# Patient Record
Sex: Female | Born: 1960 | Race: White | Hispanic: No | Marital: Single | Smoking: Former smoker
Health system: Southern US, Community
[De-identification: ages and names within clinical notes are randomized; demographics above are authoritative.]

## PROBLEM LIST (undated history)

## (undated) DIAGNOSIS — R002 Palpitations: Secondary | ICD-10-CM

## (undated) DIAGNOSIS — M5030 Other cervical disc degeneration, unspecified cervical region: Secondary | ICD-10-CM

## (undated) DIAGNOSIS — Z9889 Other specified postprocedural states: Secondary | ICD-10-CM

## (undated) DIAGNOSIS — R112 Nausea with vomiting, unspecified: Secondary | ICD-10-CM

## (undated) DIAGNOSIS — R011 Cardiac murmur, unspecified: Secondary | ICD-10-CM

## (undated) DIAGNOSIS — I10 Essential (primary) hypertension: Secondary | ICD-10-CM

## (undated) DIAGNOSIS — E785 Hyperlipidemia, unspecified: Secondary | ICD-10-CM

## (undated) HISTORY — PX: TONSILLECTOMY: SUR1361

## (undated) HISTORY — DX: Cardiac murmur, unspecified: R01.1

## (undated) HISTORY — DX: Hyperlipidemia, unspecified: E78.5

---

## 1983-04-22 HISTORY — PX: LAPAROSCOPIC SALPINGOOPHERECTOMY: SUR795

## 2002-01-25 ENCOUNTER — Ambulatory Visit (HOSPITAL_COMMUNITY): Admission: RE | Admit: 2002-01-25 | Discharge: 2002-01-25 | Payer: Self-pay | Admitting: Family Medicine

## 2002-01-25 ENCOUNTER — Encounter: Payer: Self-pay | Admitting: Family Medicine

## 2010-06-26 ENCOUNTER — Ambulatory Visit (INDEPENDENT_AMBULATORY_CARE_PROVIDER_SITE_OTHER): Payer: BC Managed Care – PPO | Admitting: Cardiovascular Disease

## 2010-06-26 ENCOUNTER — Encounter: Payer: Self-pay | Admitting: Cardiovascular Disease

## 2010-06-26 DIAGNOSIS — E785 Hyperlipidemia, unspecified: Secondary | ICD-10-CM

## 2010-06-26 DIAGNOSIS — R072 Precordial pain: Secondary | ICD-10-CM

## 2010-06-26 DIAGNOSIS — Z8679 Personal history of other diseases of the circulatory system: Secondary | ICD-10-CM | POA: Insufficient documentation

## 2010-06-26 DIAGNOSIS — R002 Palpitations: Secondary | ICD-10-CM

## 2010-06-26 DIAGNOSIS — R079 Chest pain, unspecified: Secondary | ICD-10-CM | POA: Insufficient documentation

## 2010-06-26 DIAGNOSIS — R011 Cardiac murmur, unspecified: Secondary | ICD-10-CM

## 2010-06-26 DIAGNOSIS — E782 Mixed hyperlipidemia: Secondary | ICD-10-CM | POA: Insufficient documentation

## 2010-06-27 ENCOUNTER — Encounter: Payer: Self-pay | Admitting: Cardiovascular Disease

## 2010-06-28 ENCOUNTER — Ambulatory Visit (HOSPITAL_COMMUNITY)
Admission: RE | Admit: 2010-06-28 | Discharge: 2010-06-28 | Disposition: A | Discharge status: Home / Self Care | Payer: BC Managed Care – PPO | Source: Ambulatory Visit | Attending: Cardiovascular Disease | Admitting: Cardiovascular Disease

## 2010-06-28 ENCOUNTER — Encounter: Payer: Self-pay | Admitting: Cardiovascular Disease

## 2010-06-28 ENCOUNTER — Ambulatory Visit (HOSPITAL_COMMUNITY): Payer: BC Managed Care – PPO

## 2010-06-28 DIAGNOSIS — R011 Cardiac murmur, unspecified: Secondary | ICD-10-CM | POA: Insufficient documentation

## 2010-06-28 DIAGNOSIS — I359 Nonrheumatic aortic valve disorder, unspecified: Secondary | ICD-10-CM

## 2010-07-02 NOTE — Letter (Signed)
Summary: [facility] RECORDS   Imported By: Faythe Ghee 06/27/2010 11:38:48  _____________________________________________________________________  External Attachment:    Type:   Image     Comment:   External Document

## 2010-07-02 NOTE — Letter (Signed)
Summary: Stress Echocardiogram Information Sheet  [facility] at [facility]  [address], [place] [address]   Phone: [phone]  Fax: [phone]      June 26, 2010 MRN: [id number] light prior to the test.   [facility]  Doctor: Appointment Date: Appointment Time: Appointment Location: [facility]  Stress Echocardiogram Information Sheet    Instructions:   1. DO NOT  take your _________ medicine _______ days before the test.  2. Do not eat or drink after midnight the night before test.  3. Dress prepared to exercise.  4. DO NOT use ANY caffine or tobacco products 3 hours before appointment.  5. Report to the Short Stay Center on the1st floor.  6. Please bring all current prescription medications.  7. If you have any questions, please call [phone]

## 2010-07-02 NOTE — Letter (Signed)
Summary: [facility]  [facility]   Imported By: Faythe Ghee 06/27/2010 11:40:01  _____________________________________________________________________  External Attachment:    Type:   Image     Comment:   External Document

## 2010-07-02 NOTE — Assessment & Plan Note (Signed)
Summary: *** PALPITATIONS DR Phillips Odor [id number]/ REC ON FILE 06/11/10/TMJ   Visit Type:  Initial Consult Primary Provider:  Dr.Fusco  CC:  palpatations .  History of Present Illness: Seen at the request of Dr Phillips Odor for palpitations, SSCP, family history of premature CAD and murmur.  She has had palpitations for the lst few weeks.  They preceded a URI that she has been Rx with antibiotics and depomedrol.  She seems overly concerned about her faimily hisotry.  Chest tightness with URI but improved.  Palpitaitons also improved but did have some rapid beats about 5 days ago.  No associated dyspnea, syncope, TIA or diaphoresis.  Murmur diagnosed 8-10 years ago but no recent echo.  CRF's family histroy and elevated lipids.  On statin last 5 years  Current Problems (verified): 1)  Murmur  (ICD-785.2)  Current Medications (verified): 1)  Crestor 10 Mg Tabs (Rosuvastatin Calcium) .... Take 1 Tab Daily  Allergies (verified): No Known Drug Allergies  Comments:  Nurse/Medical Assistant: patient is only on 1 med crestor 10 mg [facility] [place]  Past History:  Past Medical History: Last updated: 06/25/2010 upper respitory infection hyperlipidemia undiagnosed cardiac murmurs  Past Surgical History: Last updated: 06/25/2010 no surgical history noted  Family History: Multiple family members including brother with premature CAD  Social History: Works at [employer] Plays gold and bowls Nonsmoker Nondrinker  Review of Systems       Denies fever, malais, weight loss, blurry vision, decreased visual acuity, cough, sputum, SOB, hemoptysis, pleuritic pain,  heartburn, abdominal pain, melena, lower extremity edema, claudication, or rash.   Vital Signs:  Patient profile:   50 year old female Height:      58 inches Weight:      129 pounds BMI:     27.06 Pulse rate:   68 / minute BP sitting:   134 / 79  (left arm)  Vitals Entered By: Larita Fife Via LPN (June 26, 8655 1:15  PM)  Physical Exam  General:  Affect appropriate Healthy:  appears stated age HEENT: normal Neck supple with no adenopathy JVP normal no bruits no thyromegaly Lungs clear with no wheezing and good diaphragmatic motion Heart:  S1/S2 SEM in aortic area no rub, gallop or click PMI normal Abdomen: benighn, BS positve, no tenderness, no AAA no bruit.  No HSM or HJR Distal pulses intact with no bruits No edema Neuro non-focal Skin warm and dry    Impression & Recommendations:  Problem # 1:  MURMUR (ICD-785.2) R/O bicuspid AV  Echo  Orders: 2-D Echocardiogram (2D Echo) Stress Echo (Stress Echo)  Problem # 2:  CHEST PAIN UNSPECIFIED (ICD-786.50) Associated with congestion and URI Normal ECG  Stress echo  Problem # 3:  PALPITATIONS, HX OF (ICD-V12.50) Benign.  Improving No need for event monitor or Rx if echo and stress echo ok  Problem # 4:  MIXED HYPERLIPIDEMIA (ICD-272.2) Continue statin  Labs with Dr Phillips Odor Her updated medication list for this problem includes:    Crestor 10 Mg Tabs (Rosuvastatin calcium) .Marland Kitchen... Take 1 tab daily  Patient Instructions: 1)  Your physician recommends that you schedule a follow-up appointment in: as needed  2)  Your physician recommends that you continue on your current medications as directed. Please refer to the Current Medication list given to you today. 3)  Your physician has requested that you have a stress echocardiogram. For further information please visit [email or web address]  Please follow instruction sheet as given. 4)  Your physician has requested  that you have an echocardiogram.  Echocardiography is a painless test that uses sound waves to create images of your heart. It provides your doctor with information about the size and shape of your heart and how well your heart's chambers and valves are working.  This procedure takes approximately one hour. There are no restrictions for this procedure.   EKG Report  Procedure date:   06/26/2010  Findings:      NSR 63 Normal ECG

## 2010-11-04 ENCOUNTER — Encounter: Payer: Self-pay | Admitting: *Deleted

## 2010-11-04 ENCOUNTER — Telehealth: Payer: Self-pay | Admitting: Cardiology

## 2010-11-04 NOTE — Telephone Encounter (Signed)
Copy of report and result letter mailed to pt, also called report to pt-Ths

## 2010-11-04 NOTE — Telephone Encounter (Signed)
Pt stopped into office to have there results of stress test and to be called on home phone.Pt would like a copy of report to be mailed to home as well to sending a copy to Dr. Lenore Manner.

## 2010-11-07 NOTE — Telephone Encounter (Signed)
Not certain why this was forwarded to me. Patient was seen by Dr. Eden Emms recently with subsequent testing arranged.

## 2011-04-16 ENCOUNTER — Encounter: Payer: Self-pay | Admitting: Cardiology

## 2011-05-20 ENCOUNTER — Other Ambulatory Visit (HOSPITAL_COMMUNITY): Payer: Self-pay | Admitting: Physical Medicine and Rehabilitation

## 2011-05-20 DIAGNOSIS — M5412 Radiculopathy, cervical region: Secondary | ICD-10-CM

## 2011-05-26 ENCOUNTER — Ambulatory Visit (HOSPITAL_COMMUNITY)
Admission: RE | Admit: 2011-05-26 | Discharge: 2011-05-26 | Disposition: A | Discharge status: Home / Self Care | Payer: BC Managed Care – PPO | Source: Ambulatory Visit | Attending: Physical Medicine and Rehabilitation | Admitting: Physical Medicine and Rehabilitation

## 2011-05-26 DIAGNOSIS — M5412 Radiculopathy, cervical region: Secondary | ICD-10-CM

## 2011-05-26 DIAGNOSIS — M25519 Pain in unspecified shoulder: Secondary | ICD-10-CM | POA: Insufficient documentation

## 2011-05-26 DIAGNOSIS — M5030 Other cervical disc degeneration, unspecified cervical region: Secondary | ICD-10-CM | POA: Insufficient documentation

## 2011-05-26 DIAGNOSIS — M542 Cervicalgia: Secondary | ICD-10-CM | POA: Insufficient documentation

## 2011-05-26 DIAGNOSIS — J328 Other chronic sinusitis: Secondary | ICD-10-CM | POA: Insufficient documentation

## 2013-11-18 NOTE — H&P (Signed)
  NTS SOAP Note  Vital Signs:  Vitals as of: 7/58/8325: Systolic 498: Diastolic 79: Heart Rate 68: Temp 4F: Height 67ft 10in: Weight 128Lbs 0 Ounces: BMI 26.75  BMI : 26.75 kg/m2  Subjective: This 53 Years 54 Months old Female presents for of a wound in the perianal region.  Has been present for a year, keeps draining intermittently.  Antibiotics have been used.  Review of Symptoms:  Constitutional:unremarkable   Head:unremarkable    Eyes:unremarkable   Nose/Mouth/Throat:unremarkable Cardiovascular:  unremarkable   Respiratory:unremarkable   Gastrointestinal:  unremarkable   Genitourinary:unremarkable     Musculoskeletal:unremarkable   Hematolgic/Lymphatic:unremarkable     Allergic/Immunologic:unremarkable     Past Medical History:    Reviewed  Past Medical History  Surgical History: none Medical Problems: HTN, high cholesterol Allergies: pcn Medications: gabapentin, neicar, crestor   Social History:Reviewed  Social History  Preferred Language: English Race:  White Ethnicity: Not Hispanic / Latino Age: 53 Years 54 Months Marital Status:  S Alcohol: socially   Smoking Status: Never smoker reviewed on 11/17/2013 Functional Status reviewed on 11/17/2013 ------------------------------------------------ Bathing: Normal Cooking: Normal Dressing: Normal Driving: Normal Eating: Normal Managing Meds: Normal Oral Care: Normal Shopping: Normal Toileting: Normal Transferring: Normal Walking: Normal Cognitive Status reviewed on 11/17/2013 ------------------------------------------------ Attention: Normal Decision Making: Normal Language: Normal Memory: Normal Motor: Normal Perception: Normal Problem Solving: Normal Visual and Spatial: Normal   Family History:  Reviewed  Family Health History Mother, Deceased; Heart disease;  Father, Living; Heart disease;     Objective Information: General:  Well appearing,  well nourished in no distress. Heart:  RRR, no murmur or gallop.  Normal S1, S2.  No S3, S4.  Lungs:    CTA bilaterally, no wheezes, rhonchi, rales.  Breathing unlabored.     Small draining sinus with mild induration, left perianal region  Assessment:Residual perianal abscess  Diagnoses: 566 Perianal abscess (Anal abscess)  Procedures: 26415 - OFFICE OUTPATIENT NEW 30 MINUTES    Plan:  Scheduled for excision and drainage of perianal abscess on 12/12/13.   Patient Education:Alternative treatments to surgery were discussed with patient (and family).  Risks and benefits  of procedure were fully explained to the patient (and family) who gave informed consent. Patient/family questions were addressed.  Follow-up:Pending Surgery

## 2013-11-28 ENCOUNTER — Encounter (HOSPITAL_COMMUNITY): Payer: Self-pay | Admitting: Pharmacy Technician

## 2013-12-06 NOTE — Patient Instructions (Signed)
Amy Clayton  12/06/2013   Your procedure is scheduled on:  12/12/2013  Report to [facility] at  900  AM.  Call this number if you have problems the morning of surgery: [phone]   Remember:   Do not eat food or drink liquids after midnight.   Take these medicines the morning of surgery with A SIP OF WATER: neurontin, benicar   Do not wear jewelry, make-up or nail polish.  Do not wear lotions, powders, or perfumes.   Do not shave 48 hours prior to surgery. Men may shave face and neck.  Do not bring valuables to the hospital.  [facility] is not responsible for any belongings or valuables.               Contacts, dentures or bridgework may not be worn into surgery.  Leave suitcase in the car. After surgery it may be brought to your room.  For patients admitted to the hospital, discharge time is determined by your treatment team.               Patients discharged the day of surgery will not be allowed to drive home.  Name and phone number of your driver: family  Special Instructions: Shower using CHG 2 nights before surgery and the night before surgery.  If you shower the day of surgery use CHG.  Use special wash - you have one bottle of CHG for all showers.  You should use approximately 1/3 of the bottle for each shower.   Please read over the following fact sheets that you were given: Pain Booklet, Coughing and Deep Breathing, Surgical Site Infection Prevention, Anesthesia Post-op Instructions and Care and Recovery After Surgery Perianal Abscess An abscess is an infected area that contains a collection of pus and debris. A perianal abscess is one that occurs in the perineal area, which is the area between the anus and the scrotum in males and between the anus and the vagina in females. Perianal abscesses can vary in size. Without treatment, a perianal abscess can become larger and cause other problems. CAUSES  Glands in the perineal area can become plugged up with debris.  When this happens, an abscess may form.  SIGNS AND SYMPTOMS  The most common symptoms of a perianal abscess are:  Swelling and redness in the area of the abscess. The redness may go beyond the abscess and appear as a red streak on the skin.  Pain in the area of the abscess. Other possible symptoms include:   A visible lump or a lump that can be felt when touching the area and is usually painful.  Bleeding or pus-like discharge from the area.  Fever.  General weakness. DIAGNOSIS  Your health care provider will take a medical history and examine the area. This may involve examining the rectal area with a gloved hand (digital rectal exam). For women, it may require a careful vaginal exam. Sometimes, the health care provider needs to look into the rectum using a probe or scope. TREATMENT  Treatment often requires making a cut (incision) in the abscess to drain the pus. This can sometimes be done in your health care provider's office or an emergency department after giving you medicine to numb the area (local anesthetic). For larger or deeper abscesses, surgery may be required to drain the abscess. Antibiotic medicines are sometimes given if there is infection of the surrounding tissue (cellulitis). In some cases, gauze is packed into the abscess  to continue draining the area. Frequent sitz baths may be recommended to help the wound heal and to reduce the chance of the abscess coming back. HOME CARE INSTRUCTIONS   Only take over-the-counter or prescription medicines for pain, fever, or discomfort as directed by your health care provider.  Take antibiotic medicine as directed. Make sure you finish it even if you start to feel better.  If gauze is used in the abscess, follow your health care provider's instructions for removing or changing the gauze. It can usually be removed in 2-3 days.  If one or more drains have been placed in the abscess cavity, be careful not to pull at them. Your health  care provider will tell you how long they need to remain in place.  Take warm sitz baths 3-4 times a day and after bowel movements. This will help reduce pain and swelling.  Keep the skin around the abscess clean and dry. Avoid cleaning the area too much.  Avoid scratching the abscess area.  Avoid using colored or perfumed toilet papers. SEEK MEDICAL CARE IF:   You have trouble having a bowel movement or passing urine.  Your pain or swelling in the affected area does not seem to be improving.  The gauze packing or the drains come out before the planned time. SEEK IMMEDIATE MEDICAL CARE IF:   You have problems moving or using your legs.  You have severe or increasing pain.  Your swelling in the affected area suddenly gets worse.  You have a large increase in bleeding or passing of pus.  You have chills or a fever. MAKE SURE YOU:   Understand these instructions.  Will watch your condition.  Will get help right away if you are not doing well or get worse. Document Released: 05/14/2006 Document Revised: 01/26/2013 Document Reviewed: 11/17/2012 [facility] Patient Information 2015 [place], [place]. This information is not intended to replace advice given to you by your health care provider. Make sure you discuss any questions you have with your health care provider. PATIENT INSTRUCTIONS POST-ANESTHESIA  IMMEDIATELY FOLLOWING SURGERY:  Do not drive or operate machinery for the first twenty four hours after surgery.  Do not make any important decisions for twenty four hours after surgery or while taking narcotic pain medications or sedatives.  If you develop intractable nausea and vomiting or a severe headache please notify your doctor immediately.  FOLLOW-UP:  Please make an appointment with your surgeon as instructed. You do not need to follow up with anesthesia unless specifically instructed to do so.  WOUND CARE INSTRUCTIONS (if applicable):  Keep a dry clean dressing on the  anesthesia/puncture wound site if there is drainage.  Once the wound has quit draining you may leave it open to air.  Generally you should leave the bandage intact for twenty four hours unless there is drainage.  If the epidural site drains for more than 36-48 hours please call the anesthesia department.  QUESTIONS?:  Please feel free to call your physician or the hospital operator if you have any questions, and they will be happy to assist you.

## 2013-12-07 ENCOUNTER — Encounter (HOSPITAL_COMMUNITY)
Admission: RE | Admit: 2013-12-07 | Discharge: 2013-12-07 | Disposition: A | Discharge status: Home / Self Care | Payer: BC Managed Care – PPO | Source: Ambulatory Visit | Attending: General Surgery | Admitting: General Surgery

## 2013-12-07 ENCOUNTER — Encounter (HOSPITAL_COMMUNITY): Payer: Self-pay

## 2013-12-07 DIAGNOSIS — K612 Anorectal abscess: Secondary | ICD-10-CM | POA: Diagnosis not present

## 2013-12-07 DIAGNOSIS — Z01812 Encounter for preprocedural laboratory examination: Secondary | ICD-10-CM | POA: Insufficient documentation

## 2013-12-07 DIAGNOSIS — Z01810 Encounter for preprocedural cardiovascular examination: Secondary | ICD-10-CM | POA: Insufficient documentation

## 2013-12-07 DIAGNOSIS — Z01818 Encounter for other preprocedural examination: Secondary | ICD-10-CM | POA: Diagnosis not present

## 2013-12-07 HISTORY — DX: Essential (primary) hypertension: I10

## 2013-12-07 LAB — CBC WITH DIFFERENTIAL/PLATELET
Basophils Absolute: 0 10*3/uL (ref 0.0–0.1)
Basophils Relative: 1 % (ref 0–1)
Eosinophils Absolute: 0.2 10*3/uL (ref 0.0–0.7)
Eosinophils Relative: 3 % (ref 0–5)
HEMATOCRIT: 41.8 % (ref 36.0–46.0)
HEMOGLOBIN: 14.4 g/dL (ref 12.0–15.0)
Lymphocytes Relative: 37 % (ref 12–46)
Lymphs Abs: 2.7 10*3/uL (ref 0.7–4.0)
MCH: 30.2 pg (ref 26.0–34.0)
MCHC: 34.4 g/dL (ref 30.0–36.0)
MCV: 87.6 fL (ref 78.0–100.0)
MONOS PCT: 7 % (ref 3–12)
Monocytes Absolute: 0.5 10*3/uL (ref 0.1–1.0)
NEUTROS ABS: 3.9 10*3/uL (ref 1.7–7.7)
Neutrophils Relative %: 52 % (ref 43–77)
Platelets: 306 10*3/uL (ref 150–400)
RBC: 4.77 MIL/uL (ref 3.87–5.11)
RDW: 13.2 % (ref 11.5–15.5)
WBC: 7.4 10*3/uL (ref 4.0–10.5)

## 2013-12-07 LAB — BASIC METABOLIC PANEL
ANION GAP: 13 (ref 5–15)
BUN: 27 mg/dL — ABNORMAL HIGH (ref 6–23)
CHLORIDE: 99 meq/L (ref 96–112)
CO2: 28 meq/L (ref 19–32)
CREATININE: 0.96 mg/dL (ref 0.50–1.10)
Calcium: 10 mg/dL (ref 8.4–10.5)
GFR calc Af Amer: 76 mL/min — ABNORMAL LOW (ref >90)
GFR calc non Af Amer: 66 mL/min — ABNORMAL LOW (ref >90)
GLUCOSE: 102 mg/dL — AB (ref 70–99)
Potassium: 4.8 mEq/L (ref 3.7–5.3)
Sodium: 140 mEq/L (ref 137–147)

## 2013-12-12 ENCOUNTER — Ambulatory Visit (HOSPITAL_COMMUNITY)
Admission: RE | Admit: 2013-12-12 | Discharge: 2013-12-12 | Disposition: A | Discharge status: Home / Self Care | Payer: BC Managed Care – PPO | Source: Ambulatory Visit | Attending: General Surgery | Admitting: General Surgery

## 2013-12-12 ENCOUNTER — Encounter (HOSPITAL_COMMUNITY): Payer: Self-pay | Admitting: *Deleted

## 2013-12-12 ENCOUNTER — Encounter (HOSPITAL_COMMUNITY): Payer: BC Managed Care – PPO | Admitting: Anesthesiology

## 2013-12-12 ENCOUNTER — Encounter (HOSPITAL_COMMUNITY)
Admission: RE | Disposition: A | Discharge status: Home / Self Care | Payer: Self-pay | Source: Ambulatory Visit | Attending: General Surgery

## 2013-12-12 ENCOUNTER — Ambulatory Visit (HOSPITAL_COMMUNITY): Payer: BC Managed Care – PPO | Admitting: Anesthesiology

## 2013-12-12 DIAGNOSIS — Z7982 Long term (current) use of aspirin: Secondary | ICD-10-CM | POA: Insufficient documentation

## 2013-12-12 DIAGNOSIS — Z87891 Personal history of nicotine dependence: Secondary | ICD-10-CM | POA: Insufficient documentation

## 2013-12-12 DIAGNOSIS — E7800 Pure hypercholesterolemia, unspecified: Secondary | ICD-10-CM | POA: Insufficient documentation

## 2013-12-12 DIAGNOSIS — I10 Essential (primary) hypertension: Secondary | ICD-10-CM | POA: Diagnosis not present

## 2013-12-12 DIAGNOSIS — Z79899 Other long term (current) drug therapy: Secondary | ICD-10-CM | POA: Insufficient documentation

## 2013-12-12 DIAGNOSIS — K612 Anorectal abscess: Secondary | ICD-10-CM | POA: Diagnosis present

## 2013-12-12 HISTORY — PX: INCISION AND DRAINAGE ABSCESS: SHX5864

## 2013-12-12 SURGERY — INCISION AND DRAINAGE, ABSCESS
Anesthesia: General | Site: Buttocks

## 2013-12-12 MED ORDER — ONDANSETRON HCL 4 MG/2ML IJ SOLN: 4.0000 mg | Freq: Once | INTRAMUSCULAR | Status: DC | PRN

## 2013-12-12 MED ORDER — EPHEDRINE SULFATE 50 MG/ML IJ SOLN
INTRAMUSCULAR | Status: AC
  Filled 2013-12-12: qty 1

## 2013-12-12 MED ORDER — SUCCINYLCHOLINE CHLORIDE 20 MG/ML IJ SOLN
INTRAMUSCULAR | Status: AC
  Filled 2013-12-12: qty 1

## 2013-12-12 MED ORDER — MIDAZOLAM HCL 2 MG/2ML IJ SOLN
1.0000 mg | INTRAMUSCULAR | Status: DC | PRN
  Administered 2013-12-12: 2 mg via INTRAVENOUS

## 2013-12-12 MED ORDER — METRONIDAZOLE IN NACL 5-0.79 MG/ML-% IV SOLN
500.0000 mg | INTRAVENOUS | Status: AC
  Administered 2013-12-12: 1 g via INTRAVENOUS

## 2013-12-12 MED ORDER — MIDAZOLAM HCL 2 MG/2ML IJ SOLN
INTRAMUSCULAR | Status: AC
  Filled 2013-12-12: qty 2

## 2013-12-12 MED ORDER — METRONIDAZOLE IN NACL 5-0.79 MG/ML-% IV SOLN
INTRAVENOUS | Status: AC
  Filled 2013-12-12: qty 100

## 2013-12-12 MED ORDER — HYDROCODONE-ACETAMINOPHEN 5-325 MG PO TABS: 1.0000 | ORAL_TABLET | Freq: Four times a day (QID) | ORAL | Status: DC | PRN

## 2013-12-12 MED ORDER — FENTANYL CITRATE 0.05 MG/ML IJ SOLN
INTRAMUSCULAR | Status: DC | PRN
  Administered 2013-12-12: 25 ug via INTRAVENOUS
  Administered 2013-12-12: 50 ug via INTRAVENOUS
  Administered 2013-12-12: 25 ug via INTRAVENOUS

## 2013-12-12 MED ORDER — FENTANYL CITRATE 0.05 MG/ML IJ SOLN
INTRAMUSCULAR | Status: AC
  Filled 2013-12-12: qty 2

## 2013-12-12 MED ORDER — KETOROLAC TROMETHAMINE 30 MG/ML IJ SOLN
30.0000 mg | Freq: Once | INTRAMUSCULAR | Status: AC
  Administered 2013-12-12: 30 mg via INTRAVENOUS
  Filled 2013-12-12: qty 1

## 2013-12-12 MED ORDER — CHLORHEXIDINE GLUCONATE 4 % EX LIQD: 1.0000 "application " | Freq: Once | CUTANEOUS | Status: DC

## 2013-12-12 MED ORDER — SODIUM CHLORIDE 0.9 % IJ SOLN
INTRAMUSCULAR | Status: AC
  Filled 2013-12-12: qty 10

## 2013-12-12 MED ORDER — FENTANYL CITRATE 0.05 MG/ML IJ SOLN: 25.0000 ug | INTRAMUSCULAR | Status: DC | PRN

## 2013-12-12 MED ORDER — LACTATED RINGERS IV SOLN
INTRAVENOUS | Status: DC
  Administered 2013-12-12: 10:00:00 via INTRAVENOUS

## 2013-12-12 MED ORDER — BUPIVACAINE HCL (PF) 0.5 % IJ SOLN
INTRAMUSCULAR | Status: DC | PRN
  Administered 2013-12-12: 5 mL

## 2013-12-12 MED ORDER — ONDANSETRON HCL 4 MG/2ML IJ SOLN
4.0000 mg | Freq: Once | INTRAMUSCULAR | Status: AC
  Administered 2013-12-12: 4 mg via INTRAVENOUS

## 2013-12-12 MED ORDER — ARTIFICIAL TEARS OP OINT
TOPICAL_OINTMENT | OPHTHALMIC | Status: AC
  Filled 2013-12-12: qty 3.5

## 2013-12-12 MED ORDER — CIPROFLOXACIN IN D5W 400 MG/200ML IV SOLN
INTRAVENOUS | Status: AC
  Filled 2013-12-12: qty 200

## 2013-12-12 MED ORDER — PROPOFOL 10 MG/ML IV BOLUS
INTRAVENOUS | Status: DC | PRN
  Administered 2013-12-12: 150 mg via INTRAVENOUS

## 2013-12-12 MED ORDER — SODIUM CHLORIDE 0.9 % IR SOLN
Status: DC | PRN
  Administered 2013-12-12: 1000 mL

## 2013-12-12 MED ORDER — ONDANSETRON HCL 4 MG/2ML IJ SOLN
INTRAMUSCULAR | Status: AC
  Filled 2013-12-12: qty 2

## 2013-12-12 MED ORDER — LIDOCAINE HCL (PF) 1 % IJ SOLN
INTRAMUSCULAR | Status: AC
  Filled 2013-12-12: qty 5

## 2013-12-12 MED ORDER — FENTANYL CITRATE 0.05 MG/ML IJ SOLN
25.0000 ug | INTRAMUSCULAR | Status: AC
  Administered 2013-12-12 (×2): 25 ug via INTRAVENOUS

## 2013-12-12 MED ORDER — FENTANYL CITRATE 0.05 MG/ML IJ SOLN
INTRAMUSCULAR | Status: AC
  Filled 2013-12-12: qty 5

## 2013-12-12 MED ORDER — BUPIVACAINE HCL (PF) 0.5 % IJ SOLN
INTRAMUSCULAR | Status: AC
  Filled 2013-12-12: qty 30

## 2013-12-12 MED ORDER — LACTATED RINGERS IV SOLN
INTRAVENOUS | Status: DC | PRN
Start: 2013-12-12 — End: 2013-12-12
  Administered 2013-12-12: 10:00:00 via INTRAVENOUS

## 2013-12-12 MED ORDER — CIPROFLOXACIN IN D5W 400 MG/200ML IV SOLN
400.0000 mg | INTRAVENOUS | Status: AC
  Administered 2013-12-12: 400 mg via INTRAVENOUS
  Filled 2013-12-12: qty 200

## 2013-12-12 MED ORDER — LIDOCAINE HCL (CARDIAC) 20 MG/ML IV SOLN
INTRAVENOUS | Status: DC | PRN
  Administered 2013-12-12: 50 mg via INTRAVENOUS

## 2013-12-12 MED ORDER — BACITRACIN-NEOMYCIN-POLYMYXIN 400-5-5000 EX OINT
TOPICAL_OINTMENT | CUTANEOUS | Status: AC
  Filled 2013-12-12: qty 1

## 2013-12-12 SURGICAL SUPPLY — 30 items
BAG HAMPER (MISCELLANEOUS) ×2 IMPLANT
BNDG CONFORM 2 STRL LF (GAUZE/BANDAGES/DRESSINGS) ×1 IMPLANT
CLOTH BEACON ORANGE TIMEOUT ST (SAFETY) ×2 IMPLANT
COVER LIGHT HANDLE STERIS (MISCELLANEOUS) ×4 IMPLANT
DRESSING COVERLET 3X1 FLEXIBLE (GAUZE/BANDAGES/DRESSINGS) ×1 IMPLANT
ELECT REM PT RETURN 9FT ADLT (ELECTROSURGICAL) ×2
ELECTRODE REM PT RTRN 9FT ADLT (ELECTROSURGICAL) ×1 IMPLANT
GAUZE SPONGE 4X4 12PLY STRL (GAUZE/BANDAGES/DRESSINGS) IMPLANT
GLOVE BIOGEL M STRL SZ7.5 (GLOVE) ×2 IMPLANT
GLOVE INDICATOR 7.0 STRL GRN (GLOVE) ×1 IMPLANT
GLOVE SS BIOGEL STRL SZ 6.5 (GLOVE) IMPLANT
GLOVE SUPERSENSE BIOGEL SZ 6.5 (GLOVE) ×1
GLOVE SURG SS PI 7.5 STRL IVOR (GLOVE) ×2 IMPLANT
GOWN STRL REUS W/TWL LRG LVL3 (GOWN DISPOSABLE) ×3 IMPLANT
KIT ROOM TURNOVER APOR (KITS) ×2 IMPLANT
MANIFOLD NEPTUNE II (INSTRUMENTS) ×2 IMPLANT
MARKER SKIN DUAL TIP RULER LAB (MISCELLANEOUS) ×2 IMPLANT
NDL HYPO 25X1 1.5 SAFETY (NEEDLE) IMPLANT
NEEDLE HYPO 25X1 1.5 SAFETY (NEEDLE) ×2 IMPLANT
NS IRRIG 1000ML POUR BTL (IV SOLUTION) ×2 IMPLANT
PACK BASIC LIMB (CUSTOM PROCEDURE TRAY) IMPLANT
PACK MINOR (CUSTOM PROCEDURE TRAY) ×1 IMPLANT
PACK PERI GYN (CUSTOM PROCEDURE TRAY) ×1 IMPLANT
PAD ABD 5X9 TENDERSORB (GAUZE/BANDAGES/DRESSINGS) IMPLANT
PAD ARMBOARD 7.5X6 YLW CONV (MISCELLANEOUS) ×2 IMPLANT
SET BASIN LINEN APH (SET/KITS/TRAYS/PACK) ×2 IMPLANT
SUT CHROMIC 2 0 SH (SUTURE) IMPLANT
SUT CHROMIC 3 0 SH 27 (SUTURE) ×1 IMPLANT
SYR BULB IRRIGATION 50ML (SYRINGE) ×2 IMPLANT
SYRINGE 12CC LL (MISCELLANEOUS) ×1 IMPLANT

## 2013-12-12 NOTE — Interval H&P Note (Signed)
History and Physical Interval Note:  12/12/2013 10:06 AM  Amy Clayton  has presented today for surgery, with the diagnosis of peri-rectal abscess  The various methods of treatment have been discussed with the patient and family. After consideration of risks, benefits and other options for treatment, the patient has consented to  Procedure(s): INCISION AND DRAINAGE PERIRECTAL  ABSCESS (N/A) as a surgical intervention .  The patient's history has been reviewed, patient examined, no change in status, stable for surgery.  I have reviewed the patient's chart and labs.  Questions were answered to the patient's satisfaction.     Aviva Signs A

## 2013-12-12 NOTE — Anesthesia Postprocedure Evaluation (Signed)
  Anesthesia Post-op Note  Patient: Amy Clayton  Procedure(s) Performed: Procedure(s): INCISION AND DRAINAGE PERIRECTAL  ABSCESS (N/A)  Patient Location: PACU  Anesthesia Type:General  Level of Consciousness: awake, alert , oriented and patient cooperative  Airway and Oxygen Therapy: Patient Spontanous Breathing and Patient connected to face mask oxygen  Post-op Pain: mild  Post-op Assessment: Post-op Vital signs reviewed, Patient's Cardiovascular Status Stable, Respiratory Function Stable, Patent Airway, No signs of Nausea or vomiting and Pain level controlled  Post-op Vital Signs: Reviewed and stable  Last Vitals:  Filed Vitals:   12/12/13 1015  BP: 124/62  Pulse:   Temp:   Resp: 23    Complications: No apparent anesthesia complications

## 2013-12-12 NOTE — Anesthesia Preprocedure Evaluation (Signed)
Anesthesia Evaluation  Patient identified by MRN, date of birth, ID band Patient awake    Reviewed: Allergy & Precautions, H&P , NPO status , Patient's Chart, lab work & pertinent test results  Airway Mallampati: II TM Distance: >3 FB     Dental  (+) Teeth Intact   Pulmonary former smoker,  breath sounds clear to auscultation        Cardiovascular hypertension, Pt. on medications negative cardio ROS  Rhythm:Regular Rate:Normal     Neuro/Psych    GI/Hepatic negative GI ROS,   Endo/Other    Renal/GU      Musculoskeletal   Abdominal   Peds  Hematology   Anesthesia Other Findings   Reproductive/Obstetrics                           Anesthesia Physical Anesthesia Plan  ASA: II  Anesthesia Plan: General   Post-op Pain Management:    Induction: Intravenous  Airway Management Planned: Oral ETT  Additional Equipment:   Intra-op Plan:   Post-operative Plan: Extubation in OR  Informed Consent: I have reviewed the patients History and Physical, chart, labs and discussed the procedure including the risks, benefits and alternatives for the proposed anesthesia with the patient or authorized representative who has indicated his/her understanding and acceptance.     Plan Discussed with:   Anesthesia Plan Comments:         Anesthesia Quick Evaluation

## 2013-12-12 NOTE — Anesthesia Procedure Notes (Signed)
Procedure Name: LMA Insertion Date/Time: 12/12/2013 10:27 AM Performed by: Andree Elk, AMY A Pre-anesthesia Checklist: Patient identified, Timeout performed, Emergency Drugs available, Suction available and Patient being monitored Patient Re-evaluated:Patient Re-evaluated prior to inductionOxygen Delivery Method: Circle system utilized Preoxygenation: Pre-oxygenation with 100% oxygen Intubation Type: IV induction Ventilation: Mask ventilation without difficulty LMA: LMA inserted LMA Size: 4.0 Number of attempts: 1 Placement Confirmation: positive ETCO2 and breath sounds checked- equal and bilateral Tube secured with: Tape

## 2013-12-12 NOTE — Transfer of Care (Signed)
Immediate Anesthesia Transfer of Care Note  Patient: Amy Clayton  Procedure(s) Performed: Procedure(s): INCISION AND DRAINAGE PERIRECTAL  ABSCESS (N/A)  Patient Location: PACU  Anesthesia Type:General  Level of Consciousness: awake, alert , oriented and patient cooperative  Airway & Oxygen Therapy: Patient Spontanous Breathing and Patient connected to face mask oxygen  Post-op Assessment: Report given to PACU RN and Post -op Vital signs reviewed and stable  Post vital signs: Reviewed and stable  Complications: No apparent anesthesia complications

## 2013-12-12 NOTE — Op Note (Signed)
Patient:  Amy Clayton  DOB:  26-Sep-1960  MRN:  [id number]   Preop Diagnosis:  Perirectal abscess  Postop Diagnosis:  Same  Procedure:  Incision and debridement, perirectal abscess  Surgeon:  Aviva Signs, M.D.  Anes:  General  Indications:  Patient is a 53 year old white female who presents with a recurrence perirectal abscess. No stool or air has been noted in the drainage. Due to the recurrent nature, she now presents for incision and debridement of the perirectal abscess. The risks and benefits of the procedure were fully explained to the patient, who gave informed consent.  Procedure note:  The patient was placed in the lithotomy position after general anesthesia was administered. The perineum was prepped and draped using usual sterile technique with Betadine. Surgical site confirmation was performed.  A residual perianal granuloma was noted at the 2:00 position on examination. Elliptical incision was made in this region in order to excise the skin opening. The dissection was taken down to the soft tissue. It was probed but no tissue was tract to the rectum was noted. Granulomatous tissue was then debrided using a curette. The wound is irrigated normal saline. Sensorcaine was instilled the surrounding wound. The incision was closed using 3-0 chromic gut interrupted sutures. Bacitracin ointment and a dry sterile dressing were applied.  All tape and needle counts were correct at the end of the procedure. Patient was awakened and transferred to PACU in stable condition.  Complications:  None  EBL:  Minimal  Specimen:  None

## 2013-12-13 ENCOUNTER — Encounter (HOSPITAL_COMMUNITY): Payer: Self-pay | Admitting: General Surgery

## 2014-02-10 ENCOUNTER — Encounter: Payer: Self-pay | Admitting: *Deleted

## 2014-02-13 ENCOUNTER — Encounter: Payer: Self-pay | Admitting: Obstetrics and Gynecology

## 2014-02-13 ENCOUNTER — Ambulatory Visit (INDEPENDENT_AMBULATORY_CARE_PROVIDER_SITE_OTHER): Payer: BC Managed Care – PPO | Admitting: Obstetrics and Gynecology

## 2014-02-13 VITALS — BP 122/70 | Ht <= 58 in | Wt 129.0 lb

## 2014-02-13 DIAGNOSIS — N9089 Other specified noninflammatory disorders of vulva and perineum: Secondary | ICD-10-CM

## 2014-02-13 MED ORDER — DOXYCYCLINE HYCLATE 100 MG PO CAPS: 100.0000 mg | ORAL_CAPSULE | Freq: Two times a day (BID) | ORAL | Status: DC

## 2014-02-13 NOTE — Progress Notes (Addendum)
Patient ID: DNIYAH GRANT, female   DOB: Sep 19, 1960, 53 y.o.   MRN: [id number]   [facility] Visit  Patient name: Amy Clayton MRN [id number]  Date of birth: Oct 16, 1960  CC & HPI:  Amy Clayton is a 53 y.o. female presenting today complaining of a hard, marble-sized nodule on the entrance of her vagina that has been draining for the past week.  She is no longer taking antibiotics.  She saw Dr. Arnoldo Morale on 8/24 for incision and drainage of a perianal abscess.  Her last GYN exam and PAP was last year.  Her PAP was normal at that time.  She does not have a history of vaginal deliveries.    ROS:  All systems have been reviewed and are negative unless otherwise indicated in the HPI.  Pertinent History Reviewed:   Reviewed: Significant for  Medical         Past Medical History  Diagnosis Date  . Hyperlipidemia   . Cardiac murmur   . Upper respiratory infection   . Hypertension                               Surgical Hx:    Past Surgical History  Procedure Laterality Date  . Laparoscopic salpingoopherectomy Right 1985  . Tonsillectomy    . Incision and drainage abscess N/A 12/12/2013    Procedure: INCISION AND DRAINAGE PERIRECTAL  ABSCESS;  Surgeon: Jamesetta So, MD;  Location: [facility];  Service: General;  Laterality: N/A;   Medications: Reviewed & Updated - see associated section                      Current outpatient prescriptions:aspirin 325 MG tablet, Take 325 mg by mouth daily as needed., Disp: , Rfl: ;  gabapentin (NEURONTIN) 250 MG/5ML solution, Take 250 mg by mouth 3 (three) times daily., Disp: , Rfl: ;  ibuprofen (ADVIL,MOTRIN) 200 MG tablet, Take 200 mg by mouth daily as needed (swelling)., Disp: , Rfl: ;  olmesartan-hydrochlorothiazide (BENICAR HCT) 20-12.5 MG per tablet, Take 1 tablet by mouth daily., Disp: , Rfl:  rosuvastatin (CRESTOR) 10 MG tablet, Take 10 mg by mouth daily.  , Disp: , Rfl:    Social History: Reviewed -  reports that she quit smoking  about 5 years ago. Her smoking use included Cigarettes. She has a 25 pack-year smoking history. She has never used smokeless tobacco.  Objective Findings:  Vitals: Blood pressure 122/70, height 4\' 10"  (1.473 m), weight 129 lb (58.514 kg).  Physical Examination: General appearance - alert, well appearing, and in no distress, oriented to person, place, and time and normal appearing weight Pelvic - normal external genitalia, vulva, vagina, and adnexa,  VULVA: normal appearing vulva with no masses, tenderness or lesions, 1cm firm, hard, non-tender mass in the posterior fourchette VAGINA: normal appearing vagina with normal color and discharge, no lesions,  CERVIX: surgically absent UTERUS: surgically absent  Assessment & Plan:   A:  1. Vulvar inclusion cyst  P:  1. 14 days worth of Doxycycline 100 bid 2. Return in 2 wks for recheck  And consideration of aspiration. 3 If it recurs after aspiration, consider excision? 4.     This chart was scribed for Jonnie Kind, MD by Donato Schultz, ED Scribe. This patient was seen in Room 2 and the patient's care was started at 2:31 PM.

## 2014-02-13 NOTE — Progress Notes (Signed)
Patient ID: Amy Clayton, female   DOB: 02-Aug-1960, 53 y.o.   MRN: [id number] Pt referred for a knot in the entrance of her vagina. Pt states that it is the size of a marble and it maybe have been draining for the past week.

## 2014-02-28 ENCOUNTER — Encounter: Payer: Self-pay | Admitting: Obstetrics and Gynecology

## 2014-02-28 ENCOUNTER — Ambulatory Visit (INDEPENDENT_AMBULATORY_CARE_PROVIDER_SITE_OTHER): Payer: BC Managed Care – PPO | Admitting: Obstetrics and Gynecology

## 2014-02-28 VITALS — BP 120/70 | Ht <= 58 in | Wt 124.0 lb

## 2014-02-28 DIAGNOSIS — Z01818 Encounter for other preprocedural examination: Secondary | ICD-10-CM

## 2014-02-28 DIAGNOSIS — N907 Vulvar cyst: Secondary | ICD-10-CM | POA: Insufficient documentation

## 2014-02-28 MED ORDER — TRIAMCINOLONE ACETONIDE 0.025 % EX OINT: 1.0000 "application " | TOPICAL_OINTMENT | Freq: Two times a day (BID) | CUTANEOUS | Status: DC

## 2014-02-28 MED ORDER — DOXYCYCLINE HYCLATE 100 MG PO CAPS: 100.0000 mg | ORAL_CAPSULE | Freq: Two times a day (BID) | ORAL | Status: DC

## 2014-02-28 NOTE — Patient Instructions (Signed)
Surgery scheduled for November 24.

## 2014-02-28 NOTE — Progress Notes (Signed)
EXCISION OF 2 VULVAR ABSCESSES 03-14-14 @7 :30AM.

## 2014-02-28 NOTE — Progress Notes (Signed)
Patient ID: Amy Clayton, female   DOB: January 18, 1961, 53 y.o.   MRN: [id number] Pt here today for follow up on last visit. Pt states that the "place" has not gotten any better or worse, it has stayed the same. Pt finished taking the antibiotics.    [facility] Visit  Patient name: Amy Clayton MRN [id number]  Date of birth: 07-Aug-1960  CC & HPI:  Momo Braun Feider is a 53 y.o. female presenting today for followup of vulvar cysts. Prior I&D of left gluteal abscess has persisted, and she wants both excised.these are too deep to excise in office. Will excise in OR  ROS:  No immune disorders. Nonsmoker x 5 yr.   Pertinent History Reviewed:   Reviewed: Significant for no hx IBS, crohns, or  UC. Medical         Past Medical History  Diagnosis Date  . Hyperlipidemia   . Cardiac murmur   . Upper respiratory infection   . Hypertension                               Surgical Hx:    Past Surgical History  Procedure Laterality Date  . Laparoscopic salpingoopherectomy Right 1985  . Tonsillectomy    . Incision and drainage abscess N/A 12/12/2013    Procedure: INCISION AND DRAINAGE PERIRECTAL  ABSCESS;  Surgeon: Jamesetta So, MD;  Location: [facility];  Service: General;  Laterality: N/A;   Medications: Reviewed & Updated - see associated section                      Current outpatient prescriptions: aspirin 325 MG tablet, Take 325 mg by mouth daily as needed., Disp: , Rfl: ;  gabapentin (NEURONTIN) 250 MG/5ML solution, Take 250 mg by mouth 3 (three) times daily., Disp: , Rfl: ;  ibuprofen (ADVIL,MOTRIN) 200 MG tablet, Take 200 mg by mouth daily as needed (swelling)., Disp: , Rfl: ;  olmesartan-hydrochlorothiazide (BENICAR HCT) 20-12.5 MG per tablet, Take 1 tablet by mouth daily., Disp: , Rfl:  rosuvastatin (CRESTOR) 10 MG tablet, Take 10 mg by mouth daily.  , Disp: , Rfl:    Social History: Reviewed -  reports that she quit smoking about 5 years ago. Her smoking use included  Cigarettes. She has a 25 pack-year smoking history. She has never used smokeless tobacco.  Objective Findings:  Vitals: Blood pressure 120/70, height 4\' 10"  (1.473 m), weight 124 lb (56.246 kg).  Physical Examination: General appearance - alert, well appearing, and in no distress Mental status - alert, oriented to person, place, and time, normal mood, behavior, speech, dress, motor activity, and thought processes Eyes - pupils equal and reactive, extraocular eye movements intact Neck - supple, no significant adenopathy Chest - clear to auscultation, no wheezes, rales or rhonchi, symmetric air entry Heart - normal rate, regular rhythm, normal S1, S2, no murmurs, rubs, clicks or gallops Abdomen - soft, nontender, nondistended, no masses or organomegaly Pelvic - VULVA: left  1 cm mass on left posterior buttock.   vulvar tenderness on right, vulvar mass in posterior perineum, a 1 cm tubular firm mass from just above anal sphincter to posterior fourchette, with a pinhole opening, , possible bartholins or skene's gland , VAGINA: normal appearing vagina with normal color and discharge, no lesions, CERVIX: normal appearing cervix without discharge or lesions, UTERUS: uterus is normal size, shape, consistency and nontender, AD:  P:  1. For excision of abscess and also bartholins abscess from vulva on 24 November. Day surgery.

## 2014-03-07 NOTE — Patient Instructions (Signed)
Amy Clayton  03/07/2014   Your procedure is scheduled on:  03/14/14  Report to [facility] at [facility] [place] [facility].  Call this number if you have problems the morning of surgery: [phone]   Remember:   Do not eat food or drink liquids after midnight.   Take these medicines the morning of surgery with A SIP OF WATER: neurontin, benicar   Do not wear jewelry, make-up or nail polish.  Do not wear lotions, powders, or perfumes. You may wear deodorant.  Do not shave 48 hours prior to surgery. Men may shave face and neck.  Do not bring valuables to the hospital.  [facility] is not responsible                  for any belongings or valuables.               Contacts, dentures or bridgework may not be worn into surgery.  Leave suitcase in the car. After surgery it may be brought to your room.  For patients admitted to the hospital, discharge time is determined by your                treatment team.               Patients discharged the day of surgery will not be allowed to drive  home.  Name and phone number of your driver: family  Special Instructions: Shower using CHG 2 nights before surgery and the night before surgery.  If you shower the day of surgery use CHG.  Use special wash - you have one bottle of CHG for all showers.  You should use approximately 1/3 of the bottle for each shower.    Please read over the following fact sheets that you were given: Pain Booklet, MRSA Information, Surgical Site Infection Prevention, Anesthesia Post-op Instructions and Care and Recovery After Surgery    PATIENT INSTRUCTIONS POST-ANESTHESIA  IMMEDIATELY FOLLOWING SURGERY:  Do not drive or operate machinery for the first twenty four hours after surgery.  Do not make any important decisions for twenty four hours after surgery or while taking narcotic pain medications or sedatives.  If you develop intractable nausea and vomiting or a severe headache please notify your doctor immediately.  FOLLOW-UP:  Please  make an appointment with your surgeon as instructed. You do not need to follow up with anesthesia unless specifically instructed to do so.  WOUND CARE INSTRUCTIONS (if applicable):  Keep a dry clean dressing on the anesthesia/puncture wound site if there is drainage.  Once the wound has quit draining you may leave it open to air.  Generally you should leave the bandage intact for twenty four hours unless there is drainage.  If the epidural site drains for more than 36-48 hours please call the anesthesia department.  QUESTIONS?:  Please feel free to call your physician or the hospital operator if you have any questions, and they will be happy to assist you.

## 2014-03-08 ENCOUNTER — Encounter (HOSPITAL_COMMUNITY)
Admission: RE | Admit: 2014-03-08 | Discharge: 2014-03-08 | Disposition: A | Discharge status: Home / Self Care | Payer: BC Managed Care – PPO | Source: Ambulatory Visit | Attending: Obstetrics and Gynecology | Admitting: Obstetrics and Gynecology

## 2014-03-08 ENCOUNTER — Other Ambulatory Visit: Payer: Self-pay | Admitting: Obstetrics and Gynecology

## 2014-03-08 DIAGNOSIS — Z01812 Encounter for preprocedural laboratory examination: Secondary | ICD-10-CM | POA: Insufficient documentation

## 2014-03-08 LAB — URINALYSIS, ROUTINE W REFLEX MICROSCOPIC
Bilirubin Urine: NEGATIVE
Glucose, UA: NEGATIVE mg/dL
Hgb urine dipstick: NEGATIVE
Ketones, ur: NEGATIVE mg/dL
Leukocytes, UA: NEGATIVE
NITRITE: NEGATIVE
PROTEIN: NEGATIVE mg/dL
Urobilinogen, UA: 0.2 mg/dL (ref 0.0–1.0)
pH: 6 (ref 5.0–8.0)

## 2014-03-08 LAB — CBC WITH DIFFERENTIAL/PLATELET
BASOS ABS: 0 10*3/uL (ref 0.0–0.1)
BASOS PCT: 1 % (ref 0–1)
Eosinophils Absolute: 0.2 10*3/uL (ref 0.0–0.7)
Eosinophils Relative: 4 % (ref 0–5)
HEMATOCRIT: 41.3 % (ref 36.0–46.0)
Hemoglobin: 14.3 g/dL (ref 12.0–15.0)
Lymphocytes Relative: 47 % — ABNORMAL HIGH (ref 12–46)
Lymphs Abs: 2.6 10*3/uL (ref 0.7–4.0)
MCH: 30.2 pg (ref 26.0–34.0)
MCHC: 34.6 g/dL (ref 30.0–36.0)
MCV: 87.3 fL (ref 78.0–100.0)
MONO ABS: 0.3 10*3/uL (ref 0.1–1.0)
Monocytes Relative: 5 % (ref 3–12)
Neutro Abs: 2.4 10*3/uL (ref 1.7–7.7)
Neutrophils Relative %: 43 % (ref 43–77)
Platelets: 309 10*3/uL (ref 150–400)
RBC: 4.73 MIL/uL (ref 3.87–5.11)
RDW: 13.2 % (ref 11.5–15.5)
WBC: 5.5 10*3/uL (ref 4.0–10.5)

## 2014-03-08 LAB — BASIC METABOLIC PANEL
ANION GAP: 12 (ref 5–15)
BUN: 15 mg/dL (ref 6–23)
CHLORIDE: 101 meq/L (ref 96–112)
CO2: 28 mEq/L (ref 19–32)
CREATININE: 0.82 mg/dL (ref 0.50–1.10)
Calcium: 10.3 mg/dL (ref 8.4–10.5)
GFR, EST NON AFRICAN AMERICAN: 80 mL/min — AB (ref >90)
Glucose, Bld: 95 mg/dL (ref 70–99)
Potassium: 4.9 mEq/L (ref 3.7–5.3)
Sodium: 141 mEq/L (ref 137–147)

## 2014-03-08 NOTE — Pre-Procedure Instructions (Signed)
Patient given information to sign up for my chart at home. 

## 2014-03-13 NOTE — H&P (Addendum)
  Jonnie Kind, MD Physician Signed  Progress Notes 02/28/2014 1:44 PM    Expand All Collapse All   Patient ID: BAKER KOGLER, female DOB: 12-09-1960, 52 y.o. MRN: [id number] Pt here today for follow up on last visit. Pt states that the "place" has not gotten any better or worse, it has stayed the same. Pt finished taking the antibiotics.   [facility] Visit  Patient name: Amy Rookstool RidenourMRN [id number] Date of birth: 1960/05/11  CC & HPI:  Amy Clayton is a 53 y.o. female presenting today for followup of vulvar cysts. Prior I&D of left gluteal abscess has persisted, and she wants both excised.these are too deep to excise in office. Will excise in OR  ROS:  No immune disorders. Nonsmoker x 5 yr.   Pertinent History Reviewed:  Reviewed: Significant for no hx IBS, crohns, or UC. Medical  Past Medical History  Diagnosis Date  . Hyperlipidemia   . Cardiac murmur   . Upper respiratory infection   . Hypertension     Surgical Hx:  Past Surgical History  Procedure Laterality Date  . Laparoscopic salpingoopherectomy Right 1985  . Tonsillectomy    . Incision and drainage abscess N/A 12/12/2013    Procedure: INCISION AND DRAINAGE PERIRECTAL ABSCESS; Surgeon: Jamesetta So, MD; Location: [facility]; Service: General; Laterality: N/A;   Medications: Reviewed & Updated - see associated section  Current outpatient prescriptions: aspirin 325 MG tablet, Take 325 mg by mouth daily as needed., Disp: , Rfl: ; gabapentin (NEURONTIN) 250 MG/5ML solution, Take 250 mg by mouth 3 (three) times daily., Disp: , Rfl: ; ibuprofen (ADVIL,MOTRIN) 200 MG tablet, Take 200 mg by mouth daily as needed (swelling)., Disp: , Rfl: ; olmesartan-hydrochlorothiazide (BENICAR HCT) 20-12.5 MG per tablet, Take 1 tablet by mouth daily., Disp: , Rfl:  rosuvastatin (CRESTOR) 10 MG  tablet, Take 10 mg by mouth daily. , Disp: , Rfl:    Social History: Reviewed -  reports that she quit smoking about 5 years ago. Her smoking use included Cigarettes. She has a 25 pack-year smoking history. She has never used smokeless tobacco.  Objective Findings:  Vitals: Blood pressure 120/70, height 4\' 10"  (1.473 m), weight 124 lb (56.246 kg).  Physical Examination: General appearance - alert, well appearing, and in no distress Mental status - alert, oriented to person, place, and time, normal mood, behavior, speech, dress, motor activity, and thought processes Eyes - pupils equal and reactive, extraocular eye movements intact Neck - supple, no significant adenopathy Chest - clear to auscultation, no wheezes, rales or rhonchi, symmetric air entry Heart - normal rate, regular rhythm, normal S1, S2, no murmurs, rubs, clicks or gallops Abdomen - soft, nontender, nondistended, no masses or organomegaly Pelvic - VULVA: left 1 cm mass on left posterior buttock.  vulvar tenderness on right, vulvar mass in posterior perineum, a 1 cm tubular firm mass from just above anal sphincter to posterior fourchette, with a pinhole opening, , possible bartholins or skene's gland , VAGINA: normal appearing vagina with normal color and discharge, no lesions, CERVIX: normal appearing cervix without discharge or lesions, UTERUS: uterus is normal size, shape, consistency and nontender, AD:  P:  1. For excision of abscess and also bartholins abscess from vulva on 24 November. Day surgery.

## 2014-03-13 NOTE — H&P (Signed)
   [facility] Visit  Patient name: Amy Bratcher RidenourMRN [id number] Date of birth: 1960/12/10  CC & HPI:  Amy Clayton is a 53 y.o. female presenting today for followup of vulvar cysts. Prior I&D of left gluteal abscess has persisted, and she wants both excised.these are too deep to excise in office. Will excise in OR  ROS:  No immune disorders. Nonsmoker x 5 yr.   Pertinent History Reviewed:  Reviewed: Significant for no hx IBS, crohns, or UC. Medical  Past Medical History  Diagnosis Date  . Hyperlipidemia   . Cardiac murmur   . Upper respiratory infection   . Hypertension     Surgical Hx:  Past Surgical History  Procedure Laterality Date  . Laparoscopic salpingoopherectomy Right 1985  . Tonsillectomy    . Incision and drainage abscess N/A 12/12/2013    Procedure: INCISION AND DRAINAGE PERIRECTAL ABSCESS; Surgeon: Jamesetta So, MD; Location: [facility]; Service: General; Laterality: N/A;   Medications: Reviewed & Updated - see associated section  Current outpatient prescriptions: aspirin 325 MG tablet, Take 325 mg by mouth daily as needed., Disp: , Rfl: ; gabapentin (NEURONTIN) 250 MG/5ML solution, Take 250 mg by mouth 3 (three) times daily., Disp: , Rfl: ; ibuprofen (ADVIL,MOTRIN) 200 MG tablet, Take 200 mg by mouth daily as needed (swelling)., Disp: , Rfl: ; olmesartan-hydrochlorothiazide (BENICAR HCT) 20-12.5 MG per tablet, Take 1 tablet by mouth daily., Disp: , Rfl:  rosuvastatin (CRESTOR) 10 MG tablet, Take 10 mg by mouth daily. , Disp: , Rfl:    Social History: Reviewed -  reports that she quit smoking about 5 years ago. Her smoking use included Cigarettes. She has a 25 pack-year smoking history. She has never used smokeless tobacco.  Objective Findings:  Vitals: Blood pressure 120/70, height 4\' 10"  (1.473 m), weight 124 lb (56.246  kg).  Physical Examination: General appearance - alert, well appearing, and in no distress Mental status - alert, oriented to person, place, and time, normal mood, behavior, speech, dress, motor activity, and thought processes Eyes - pupils equal and reactive, extraocular eye movements intact Neck - supple, no significant adenopathy Chest - clear to auscultation, no wheezes, rales or rhonchi, symmetric air entry Heart - normal rate, regular rhythm, normal S1, S2, no murmurs, rubs, clicks or gallops Abdomen - soft, nontender, nondistended, no masses or organomegaly Pelvic - VULVA: left 1 cm mass on left posterior buttock.  vulvar tenderness on right, vulvar mass in posterior perineum, a 1 cm tubular firm mass from just above anal sphincter to posterior fourchette, with a pinhole opening, , possible bartholins or skene's gland , VAGINA: normal appearing vagina with normal color and discharge, no lesions, CERVIX: normal appearing cervix without discharge or lesions, UTERUS: uterus is normal size, shape, consistency and nontender, AD:  P:  1. For excision of abscess and also bartholins abscess from vulva on 24 November. Day surgery.

## 2014-03-14 ENCOUNTER — Ambulatory Visit (HOSPITAL_COMMUNITY)
Admission: RE | Admit: 2014-03-14 | Discharge: 2014-03-14 | Disposition: A | Discharge status: Home / Self Care | Payer: BC Managed Care – PPO | Source: Ambulatory Visit | Attending: Obstetrics and Gynecology | Admitting: Obstetrics and Gynecology

## 2014-03-14 ENCOUNTER — Encounter (HOSPITAL_COMMUNITY)
Admission: RE | Disposition: A | Discharge status: Home / Self Care | Payer: Self-pay | Source: Ambulatory Visit | Attending: Obstetrics and Gynecology

## 2014-03-14 ENCOUNTER — Telehealth: Payer: Self-pay | Admitting: Obstetrics and Gynecology

## 2014-03-14 ENCOUNTER — Ambulatory Visit (HOSPITAL_COMMUNITY): Payer: BC Managed Care – PPO | Admitting: Anesthesiology

## 2014-03-14 ENCOUNTER — Encounter (HOSPITAL_COMMUNITY): Payer: Self-pay | Admitting: *Deleted

## 2014-03-14 ENCOUNTER — Telehealth: Payer: Self-pay | Admitting: *Deleted

## 2014-03-14 DIAGNOSIS — N907 Vulvar cyst: Secondary | ICD-10-CM | POA: Diagnosis present

## 2014-03-14 DIAGNOSIS — Z7982 Long term (current) use of aspirin: Secondary | ICD-10-CM | POA: Insufficient documentation

## 2014-03-14 DIAGNOSIS — K603 Anal fistula: Secondary | ICD-10-CM | POA: Insufficient documentation

## 2014-03-14 DIAGNOSIS — N764 Abscess of vulva: Secondary | ICD-10-CM

## 2014-03-14 DIAGNOSIS — F17200 Nicotine dependence, unspecified, uncomplicated: Secondary | ICD-10-CM | POA: Diagnosis not present

## 2014-03-14 DIAGNOSIS — E785 Hyperlipidemia, unspecified: Secondary | ICD-10-CM | POA: Insufficient documentation

## 2014-03-14 DIAGNOSIS — Z79899 Other long term (current) drug therapy: Secondary | ICD-10-CM | POA: Insufficient documentation

## 2014-03-14 DIAGNOSIS — I10 Essential (primary) hypertension: Secondary | ICD-10-CM | POA: Diagnosis not present

## 2014-03-14 HISTORY — PX: MASS EXCISION: SHX2000

## 2014-03-14 SURGERY — EXCISION MASS
Anesthesia: General

## 2014-03-14 MED ORDER — DOXYCYCLINE HYCLATE 100 MG PO CAPS: 100.0000 mg | ORAL_CAPSULE | Freq: Two times a day (BID) | ORAL | Status: DC

## 2014-03-14 MED ORDER — ONDANSETRON HCL 4 MG/2ML IJ SOLN
INTRAMUSCULAR | Status: DC | PRN
  Administered 2014-03-14: 4 mg via INTRAVENOUS

## 2014-03-14 MED ORDER — FENTANYL CITRATE 0.05 MG/ML IJ SOLN: 25.0000 ug | INTRAMUSCULAR | Status: DC | PRN

## 2014-03-14 MED ORDER — MIDAZOLAM HCL 2 MG/2ML IJ SOLN
1.0000 mg | INTRAMUSCULAR | Status: DC | PRN
  Administered 2014-03-14: 2 mg via INTRAVENOUS

## 2014-03-14 MED ORDER — PROPOFOL 10 MG/ML IV EMUL
INTRAVENOUS | Status: AC
  Filled 2014-03-14: qty 20

## 2014-03-14 MED ORDER — OXYCODONE-ACETAMINOPHEN 5-325 MG PO TABS: 1.0000 | ORAL_TABLET | ORAL | Status: DC | PRN

## 2014-03-14 MED ORDER — MIDAZOLAM HCL 2 MG/2ML IJ SOLN
INTRAMUSCULAR | Status: AC
  Filled 2014-03-14: qty 2

## 2014-03-14 MED ORDER — FENTANYL CITRATE 0.05 MG/ML IJ SOLN
INTRAMUSCULAR | Status: AC
  Filled 2014-03-14: qty 2

## 2014-03-14 MED ORDER — LIDOCAINE HCL (CARDIAC) 20 MG/ML IV SOLN
INTRAVENOUS | Status: DC | PRN
  Administered 2014-03-14: 50 mg via INTRAVENOUS

## 2014-03-14 MED ORDER — CEFAZOLIN SODIUM-DEXTROSE 2-3 GM-% IV SOLR
INTRAVENOUS | Status: DC | PRN
  Administered 2014-03-14: 2 g via INTRAVENOUS

## 2014-03-14 MED ORDER — FENTANYL CITRATE 0.05 MG/ML IJ SOLN
25.0000 ug | INTRAMUSCULAR | Status: AC
  Administered 2014-03-14 (×2): 25 ug via INTRAVENOUS

## 2014-03-14 MED ORDER — CEFAZOLIN SODIUM-DEXTROSE 2-3 GM-% IV SOLR
INTRAVENOUS | Status: AC
  Filled 2014-03-14: qty 50

## 2014-03-14 MED ORDER — CEFAZOLIN SODIUM-DEXTROSE 2-3 GM-% IV SOLR: 2.0000 g | INTRAVENOUS | Status: DC

## 2014-03-14 MED ORDER — ONDANSETRON HCL 4 MG/2ML IJ SOLN
4.0000 mg | Freq: Once | INTRAMUSCULAR | Status: AC | PRN
  Administered 2014-03-14: 4 mg via INTRAVENOUS
  Filled 2014-03-14: qty 2

## 2014-03-14 MED ORDER — PROPOFOL 10 MG/ML IV BOLUS
INTRAVENOUS | Status: DC | PRN
  Administered 2014-03-14: 150 mg via INTRAVENOUS

## 2014-03-14 MED ORDER — BACITRACIN-NEOMYCIN-POLYMYXIN 400-5-5000 EX OINT
TOPICAL_OINTMENT | CUTANEOUS | Status: AC
  Filled 2014-03-14: qty 1

## 2014-03-14 MED ORDER — BACITRACIN-NEOMYCIN-POLYMYXIN 400-5-5000 EX OINT
TOPICAL_OINTMENT | CUTANEOUS | Status: DC | PRN
  Administered 2014-03-14: 1 via TOPICAL

## 2014-03-14 MED ORDER — METRONIDAZOLE 500 MG PO TABS: 500.0000 mg | ORAL_TABLET | Freq: Two times a day (BID) | ORAL | Status: DC

## 2014-03-14 MED ORDER — POLYETHYLENE GLYCOL 3350 17 GM/SCOOP PO POWD: 1.0000 | Freq: Two times a day (BID) | ORAL | Status: DC

## 2014-03-14 MED ORDER — BUPIVACAINE-EPINEPHRINE 0.5% -1:200000 IJ SOLN
INTRAMUSCULAR | Status: DC | PRN
  Administered 2014-03-14: 17 mL

## 2014-03-14 MED ORDER — FENTANYL CITRATE 0.05 MG/ML IJ SOLN
INTRAMUSCULAR | Status: DC | PRN
  Administered 2014-03-14 (×8): 25 ug via INTRAVENOUS

## 2014-03-14 MED ORDER — LACTATED RINGERS IV SOLN
INTRAVENOUS | Status: DC
  Administered 2014-03-14: 07:00:00 via INTRAVENOUS

## 2014-03-14 MED ORDER — BUPIVACAINE-EPINEPHRINE (PF) 0.5% -1:200000 IJ SOLN
INTRAMUSCULAR | Status: AC
  Filled 2014-03-14: qty 30

## 2014-03-14 MED ORDER — ONDANSETRON HCL 4 MG/2ML IJ SOLN
4.0000 mg | Freq: Once | INTRAMUSCULAR | Status: AC
  Administered 2014-03-14: 4 mg via INTRAVENOUS

## 2014-03-14 MED ORDER — ONDANSETRON HCL 4 MG/2ML IJ SOLN
INTRAMUSCULAR | Status: AC
  Filled 2014-03-14: qty 2

## 2014-03-14 MED ORDER — 0.9 % SODIUM CHLORIDE (POUR BTL) OPTIME
TOPICAL | Status: DC | PRN
  Administered 2014-03-14: 1000 mL

## 2014-03-14 MED ORDER — LACTATED RINGERS IV SOLN
INTRAVENOUS | Status: DC | PRN
  Administered 2014-03-14 (×2): via INTRAVENOUS

## 2014-03-14 SURGICAL SUPPLY — 34 items
BAG HAMPER (MISCELLANEOUS) ×2 IMPLANT
CATH ROBINSON RED A/P 16FR (CATHETERS) IMPLANT
CLOTH BEACON ORANGE TIMEOUT ST (SAFETY) ×2 IMPLANT
COVER LIGHT HANDLE STERIS (MISCELLANEOUS) ×4 IMPLANT
DECANTER SPIKE VIAL GLASS SM (MISCELLANEOUS) ×2 IMPLANT
DRAPE PROXIMA HALF (DRAPES) ×1 IMPLANT
ELECT REM PT RETURN 9FT ADLT (ELECTROSURGICAL) ×2
ELECTRODE REM PT RTRN 9FT ADLT (ELECTROSURGICAL) ×1 IMPLANT
FORMALIN 10 PREFIL 120ML (MISCELLANEOUS) ×1 IMPLANT
GLOVE BIO SURGEON STRL SZ 6.5 (GLOVE) ×1 IMPLANT
GLOVE BIOGEL PI IND STRL 7.0 (GLOVE) IMPLANT
GLOVE BIOGEL PI IND STRL 9 (GLOVE) ×1 IMPLANT
GLOVE BIOGEL PI INDICATOR 7.0 (GLOVE) ×2
GLOVE BIOGEL PI INDICATOR 9 (GLOVE) ×1
GLOVE ECLIPSE 9.0 STRL (GLOVE) ×3 IMPLANT
GOWN SPEC L3 XXLG W/TWL (GOWN DISPOSABLE) ×3 IMPLANT
GOWN STRL REUS W/TWL LRG LVL3 (GOWN DISPOSABLE) ×2 IMPLANT
KIT ROOM TURNOVER AP CYSTO (KITS) ×2 IMPLANT
MANIFOLD NEPTUNE II (INSTRUMENTS) ×2 IMPLANT
NDL HYPO 25X1 1.5 SAFETY (NEEDLE) IMPLANT
NEEDLE HYPO 25X1 1.5 SAFETY (NEEDLE) ×2 IMPLANT
NS IRRIG 1000ML POUR BTL (IV SOLUTION) ×2 IMPLANT
PACK PERI GYN (CUSTOM PROCEDURE TRAY) ×2 IMPLANT
PAD ARMBOARD 7.5X6 YLW CONV (MISCELLANEOUS) ×2 IMPLANT
PAD TELFA 3X4 1S STER (GAUZE/BANDAGES/DRESSINGS) ×1 IMPLANT
SET BASIN LINEN APH (SET/KITS/TRAYS/PACK) ×2 IMPLANT
SUT CHROMIC 2 0 SH (SUTURE) ×1 IMPLANT
SUT PROLENE 4 0 PS 2 18 (SUTURE) ×1 IMPLANT
SUT VIC AB 2-0 CT2 27 (SUTURE) ×1 IMPLANT
SUT VIC AB 3-0 SH 27 (SUTURE) ×4
SUT VIC AB 3-0 SH 27X BRD (SUTURE) ×1 IMPLANT
SYR BULB IRRIGATION 50ML (SYRINGE) ×1 IMPLANT
SYR CONTROL 10ML LL (SYRINGE) ×1 IMPLANT
TRAY FOLEY CATH 16FR SILVER (SET/KITS/TRAYS/PACK) IMPLANT

## 2014-03-14 NOTE — Transfer of Care (Signed)
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

## 2014-03-14 NOTE — Interval H&P Note (Signed)
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 After consideration of risks, benefits and other options for treatment, the patient has consented to  Procedure(s): EXCISION VULVAR ABSCESS; EXCISION LEFT GLUTEAL ABSCESS (N/A) as a surgical intervention .  The patient's history has been reviewed, patient examined, no change in status, stable for surgery.  I have reviewed the patient's chart and labs.  Questions were answered to the patient's satisfaction.     Jonnie Kind

## 2014-03-14 NOTE — Interval H&P Note (Signed)
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 After consideration of risks, benefits and other options for treatment, the patient has consented to  Procedure(s): EXCISION VULVAR ABSCESS; EXCISION LEFT GLUTEAL ABSCESS (N/A) as a surgical intervention .  The patient's history has been reviewed, patient examined, no change in status, stable for surgery.  I have reviewed the patient's chart and labs.  Questions were answered to the patient's satisfaction.     Amy Clayton

## 2014-03-14 NOTE — Discharge Instructions (Signed)
Keep clean and dry Notify of temp greater 102.0 Notify of any foul odor or drainage Notify of swelling Keep follow up appointment

## 2014-03-14 NOTE — Anesthesia Postprocedure Evaluation (Signed)
  Anesthesia Post-op Note  Patient: Amy Clayton  Procedure(s) Performed: Procedure(s): EXCISION OF VULVAR FIBROSIS; EXCISION LEFT GLUTEAL FISTULA  IN ANO (N/A)  Patient Location: PACU  Anesthesia Type:General  Level of Consciousness: awake, alert , oriented, patient cooperative and responds to stimulation  Airway and Oxygen Therapy: Patient Spontanous Breathing and Patient connected to face mask oxygen  Post-op Pain: none  Post-op Assessment: Post-op Vital signs reviewed, Patient's Cardiovascular Status Stable, Respiratory Function Stable, Patent Airway, No signs of Nausea or vomiting and Pain level controlled  Post-op Vital Signs: Reviewed and stable  Last Vitals:  Filed Vitals:   03/14/14 0915  BP:   Temp: 36.4 C  Resp:     Complications: No apparent anesthesia complications

## 2014-03-14 NOTE — Anesthesia Preprocedure Evaluation (Addendum)
Anesthesia Evaluation  Patient identified by MRN, date of birth, ID band Patient awake    Reviewed: Allergy & Precautions, H&P , NPO status , Patient's Chart, lab work & pertinent test results  Airway Mallampati: II  TM Distance: >3 FB     Dental  (+) Teeth Intact   Pulmonary former smoker,  breath sounds clear to auscultation        Cardiovascular hypertension, Pt. on medications negative cardio ROS  Rhythm:Regular Rate:Normal     Neuro/Psych    GI/Hepatic negative GI ROS,   Endo/Other    Renal/GU      Musculoskeletal   Abdominal   Peds  Hematology   Anesthesia Other Findings   Reproductive/Obstetrics                             Anesthesia Physical Anesthesia Plan  ASA: II  Anesthesia Plan: General   Post-op Pain Management:    Induction: Intravenous  Airway Management Planned: Oral ETT  Additional Equipment:   Intra-op Plan:   Post-operative Plan: Extubation in OR  Informed Consent: I have reviewed the patients History and Physical, chart, labs and discussed the procedure including the risks, benefits and alternatives for the proposed anesthesia with the patient or authorized representative who has indicated his/her understanding and acceptance.     Plan Discussed with:   Anesthesia Plan Comments:         Anesthesia Quick Evaluation

## 2014-03-14 NOTE — Anesthesia Procedure Notes (Signed)
Procedure Name: LMA Insertion Date/Time: 03/14/2014 7:44 AM Performed by: Gershon Mussel, Cruz Bong Pre-anesthesia Checklist: Patient identified, Timeout performed, Emergency Drugs available, Suction available and Patient being monitored Patient Re-evaluated:Patient Re-evaluated prior to inductionOxygen Delivery Method: Circle system utilized Preoxygenation: Pre-oxygenation with 100% oxygen Intubation Type: IV induction LMA: LMA inserted LMA Size: 4.0 Dental Injury: Teeth and Oropharynx as per pre-operative assessment

## 2014-03-14 NOTE — Telephone Encounter (Signed)
I spoke with the pharmacy and the miralax Rx the dispense amount was put into the sig amount. Rx clarified.

## 2014-03-15 ENCOUNTER — Encounter (HOSPITAL_COMMUNITY): Payer: Self-pay | Admitting: Obstetrics and Gynecology

## 2014-03-21 ENCOUNTER — Ambulatory Visit (INDEPENDENT_AMBULATORY_CARE_PROVIDER_SITE_OTHER): Payer: BC Managed Care – PPO | Admitting: Adult Health

## 2014-03-21 ENCOUNTER — Encounter: Payer: Self-pay | Admitting: Adult Health

## 2014-03-21 VITALS — BP 120/70 | Ht <= 58 in | Wt 127.0 lb

## 2014-03-21 DIAGNOSIS — Z9889 Other specified postprocedural states: Secondary | ICD-10-CM

## 2014-03-21 NOTE — Progress Notes (Signed)
Subjective:     Patient ID: Amy Clayton, female   DOB: 04-07-1961, 53 y.o.   MRN: [id number]  HPI Steven is a 53 year old white female in for post op exam.She had an abscess revised and area at introitus revised on 11/24 by Dr Glo Herring.Still taking miralax and doxycycline.  Review of Systems See HPI Reviewed past medical,surgical, social and family history. Reviewed medications and allergies.     Objective:   Physical Exam BP 120/70 mmHg  Ht 4\' 10"  (1.473 m)  Wt 127 lb (57.607 kg)  BMI 26.55 kg/m2   Area at base of left labia healing, edges together, no redness, mild swelling, and the area at introitus the edges are together, with mild redness and swelling to the right, no sutures seen, no drainage or odor, will recheck in 1 week, still no bowling.  Assessment:     Post op exam of peri area    Plan:     Continue mira lax and doxycycline Follow up in 1 week with Dr Glo Herring Use wet wipes and squirt bottle with warm water

## 2014-03-21 NOTE — Patient Instructions (Addendum)
Continue miralax  Follow up in 1 week

## 2014-03-21 NOTE — Telephone Encounter (Signed)
Done

## 2014-03-29 ENCOUNTER — Encounter: Payer: Self-pay | Admitting: Obstetrics and Gynecology

## 2014-03-29 ENCOUNTER — Ambulatory Visit (INDEPENDENT_AMBULATORY_CARE_PROVIDER_SITE_OTHER): Payer: BC Managed Care – PPO | Admitting: Obstetrics and Gynecology

## 2014-03-29 VITALS — BP 120/70 | Ht <= 58 in | Wt 126.0 lb

## 2014-03-29 DIAGNOSIS — K603 Anal fistula: Secondary | ICD-10-CM

## 2014-03-29 DIAGNOSIS — Z9889 Other specified postprocedural states: Secondary | ICD-10-CM

## 2014-03-29 NOTE — Brief Op Note (Signed)
03/14/2014  9:03 PM  PATIENT:  Amy Clayton  53 y.o. 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   Delay start of Pharmacological VTE agent (>24hrs) due to surgical blood loss or risk of bleeding: not applicable Details of procedure : the patient was prepped and draped in lithotomy position, with time out conducted, antibiotics administered.   The firm fibrotic tissue in the midline of the perineum was identified, and overlying skin opened,and the fibrosis grasped and shelled out of adjacent fatty tissue, down to the external anal sphincter's anterior outermost margin, and amputated.  No purulence or discharge or odor was encountered.  The area of defect was closed from it's base with a series of surtures of 2-0 vicryl.  Skin was closed with 4-0 vicryl. The nonhealing opening on the left gluteal area, 4 cm from the anus and lateral to the perineal body, was circumscribed and sharply dissected down to the underlying fatty tissue.  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 With a left index finger in the rectum for anatomic orientation, the fistula could be cored out and traced to just outside the anal mucosa , crossclamped, ligated , excised , and the defect in the perineum closed by a series of 2-0 vicryl sutures. Skin was closed with 4- 0 vicryl.   Pt allowed to go to recovery, and will be sent home on continued antibiotics x 2 wk.

## 2014-03-29 NOTE — Progress Notes (Signed)
Patient ID: Amy Clayton, female   DOB: 1960-06-19, 53 y.o.   MRN: [id number] Pt here today for post op visit. Pt denies any problems or concerns at this time.

## 2014-03-29 NOTE — Progress Notes (Signed)
Subjective:     Amy Clayton is a 53 y.o. female who presents to the clinic 2 weeks status post Vulvar abscess Excision for vulvar abscess and bartholins abscess. Diet:       regular without difficulty. Bowel function is: normal. Pain:     The patient is not having any pain.   She denies having a colonoscopy. She wanted to wait until her current issues were resolved. she denies needing to take any pain medications. She denies any symptoms.  The following portions of the patient's history were reviewed and updated as appropriate: allergies, current medications, past family history, past medical history, past social history, past surgical history and problem list.  Review of Systems Pertinent items are noted in HPI.    Objective:    BP 120/70 mmHg  Ht 4\' 10"  (1.473 m)  Wt 126 lb (57.153 kg)  BMI 26.34 kg/m2 General:  alert and cooperative  Abdomen: soft, bowel sounds active, non-tender  Incision:   healing well, no drainage, no erythema, no hernia, no seroma, no swelling, no dehiscence, incision well approximated       Pelvic: VULVA: normal appearing vulva with no masses, tenderness or lesions, the left gluteal area has healed well,  , VAGINA: introitus has a small incompletely healed area shallow in midline, 3 mm size, not deep. On RV exam there is scarring but no purulence or d/c  , CERVIX: ,  UTERUS: ,  ADNEXA: ,  RECTAL: scarring above sphincter, but no induration, no discharge, and minimal discomfort  Well healing incision on the left side of the anus is complete. Scar tissue present at surgical site. Pinhole noted at epitheium of midline perineum 82mm.     Assessment:  1. 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 Operative findings again reviewed. Pathology report discussed.   Will fill metronidazole to be used prn if the perineum becomes sore.   Plan:    1. Discontinue antibiotic medications a refill of the antibiotics will be  given if a flare up occurs. 2. Wound care discussed. 3. Activity restrictions: none and continue with day-to-day activities 4. Anticipated return to work: not applicable. 5. Follow up: 1 month for  ROS for the Fistula in ano.    This chart was scribed for Jonnie Kind, MD by Steva Colder, ED Scribe. The patient was seen in room 2 at 10:18 AM.

## 2014-03-29 NOTE — Op Note (Signed)
See details of surgery in Brief OP note.

## 2014-04-04 ENCOUNTER — Telehealth: Payer: Self-pay | Admitting: Obstetrics and Gynecology

## 2014-04-04 NOTE — Telephone Encounter (Signed)
Pt states that she is still having discomfort on her left side in the ovary area, is this normal? Pt states that Dr.Ferguson told her that it would be normal to have discomfort in that area since she had infection in the area that she did. The pt wants to know if she should be concerned because the discomfort she is still having or if this is normal. Is there still infection that she may have there?

## 2014-04-04 NOTE — Telephone Encounter (Signed)
Patient reports chronic left lower quadrant discomfort which persists not had any fever. She has had several episodes of antibiotic use associated with the surgery and the fistula in ano.. She had not had her colonoscopy. She describes the perineal body is still little bit slower with Amount of discharge. I am concerned that she may be developing a recurrent fistula in the anal in the middle perineal body. She denies pain on the left buttock. Impression. I doubt that left lower quadrant pain is associated with ovaries as she is postmenopausal. We'll follow her up in January 6 and if pain persists we'll consider CT of the abdomen and pelvis. She may wish to discuss this with Dr. Nevada Crane if the pain persists

## 2014-04-26 ENCOUNTER — Ambulatory Visit (INDEPENDENT_AMBULATORY_CARE_PROVIDER_SITE_OTHER): Payer: BLUE CROSS/BLUE SHIELD | Admitting: Obstetrics and Gynecology

## 2014-04-26 VITALS — BP 130/80 | Ht <= 58 in | Wt 130.0 lb

## 2014-04-26 DIAGNOSIS — K603 Anal fistula: Secondary | ICD-10-CM

## 2014-04-26 MED ORDER — DOXYCYCLINE HYCLATE 100 MG PO CAPS: 100.0000 mg | ORAL_CAPSULE | Freq: Two times a day (BID) | ORAL | Status: DC

## 2014-04-26 NOTE — Progress Notes (Signed)
Patient ID: Amy Clayton, female   DOB: Dec 08, 1960, 54 y.o.   MRN: [id number] Pt here today for post op. Pt states that one of the areas swelled again Sunday and stayed that way until yesterday and it opened up and drained some and is still draining today.

## 2014-04-26 NOTE — Progress Notes (Signed)
Subjective:     Amy Clayton is a 54 y.o. female who presents to the clinic 5 weeks 6 days status post [place]; EXCISION LEFT GLUTEAL FISTULA IN ANO for fistula in ano and vulvar fibrosis. I am concerned that this represents inflammatory bowel disease, as pt has non reason for this to have developed Last week the pt developed recurrent discharge and tenderness at site of Fistula site on left inner buttock, and she notes mucus and discharge. No fever, no flatus thru site. She is NOT noticing d/c at vaginal entrance Diet:       regular without difficulty. Bowel function is: normal. Pain:     The patient is not having any pain.  Pt states that one of the areas swelled again Sunday and stayed that way until yesterday and it opened up and drained some and is still draining today.    Review of Systems Pertinent items are noted in HPI.    Objective:    BP 130/80 mmHg  Ht 4\' 10"  (1.473 m)  Wt 130 lb (58.968 kg)  BMI 27.18 kg/m2 General:  alert and cooperative  Abdomen: soft, bowel sounds active, non-tender  Incision:   3 mm separation , 1/3 of the length of incision at skin with some fibrosis in the tissue beneath the scar extending over to the midline perineum. The introitus has some mucus at the posterior fourchette, 2 mm size, and I cannot express any purulence , but I suspect theres a tiny draining site there as well.       Pelvic:  Pelvic - , VULVA: vulvar tenderness at area of firmness, mild,                   , VAGINA: normal appearing vagina with normal color and discharge, no lesions, vaginal tenderness minimal at introitus.                                  , vaginal discharge - creamy, thick and tiny amount only at posterior fourchette  Rectal - not done     Assessment:   1. 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 Suspected inflammatory bowel disease. Will tx doxy x 14 day and refer to [facility].  Postoperative course complicated by recurrent fistula in ano Operative  findings again reviewed. Pathology report discussed.   Plan:    1. Continue any current medications and Cipro BID x 14 days 2. Wound care discussed. 3. Activity restrictions: none 4. Anticipated return to work: now. 5. Follow up: 3 months for  recheck. 6. F/U with Dr. Laural Golden for further workup  This chart was scribed for Jonnie Kind, MD by Steva Colder, ED Scribe. The patient was seen in room 1 at 10:01 AM.

## 2014-04-27 NOTE — Progress Notes (Signed)
Will arrange for OV.

## 2014-05-08 ENCOUNTER — Telehealth: Payer: Self-pay | Admitting: Obstetrics and Gynecology

## 2014-05-10 NOTE — Telephone Encounter (Signed)
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 It was first I&D'd by jenkins, then the sinus tract dissected out by me, but recurred. I wonder if crohn should be considered.  Your opinion is requested on how to proceed.

## 2014-05-16 NOTE — Progress Notes (Signed)
Apt has been scheduled for 05/24/14 with Deberah Castle, NP.

## 2014-05-24 ENCOUNTER — Other Ambulatory Visit (INDEPENDENT_AMBULATORY_CARE_PROVIDER_SITE_OTHER): Payer: Self-pay | Admitting: *Deleted

## 2014-05-24 ENCOUNTER — Telehealth (INDEPENDENT_AMBULATORY_CARE_PROVIDER_SITE_OTHER): Payer: Self-pay | Admitting: *Deleted

## 2014-05-24 ENCOUNTER — Ambulatory Visit (INDEPENDENT_AMBULATORY_CARE_PROVIDER_SITE_OTHER): Payer: BLUE CROSS/BLUE SHIELD | Admitting: Internal Medicine

## 2014-05-24 ENCOUNTER — Encounter (INDEPENDENT_AMBULATORY_CARE_PROVIDER_SITE_OTHER): Payer: Self-pay | Admitting: Internal Medicine

## 2014-05-24 VITALS — BP 118/54 | HR 72 | Temp 98.1°F | Ht <= 58 in | Wt 132.4 lb

## 2014-05-24 DIAGNOSIS — L02215 Cutaneous abscess of perineum: Secondary | ICD-10-CM

## 2014-05-24 DIAGNOSIS — K610 Anal abscess: Secondary | ICD-10-CM

## 2014-05-24 DIAGNOSIS — Z1211 Encounter for screening for malignant neoplasm of colon: Secondary | ICD-10-CM

## 2014-05-24 LAB — C-REACTIVE PROTEIN: CRP: <0.5 mg/dL (ref <0.60)

## 2014-05-24 MED ORDER — PEG-KCL-NACL-NASULF-NA ASC-C 100 G PO SOLR: 1.0000 | Freq: Once | ORAL | Status: DC

## 2014-05-24 NOTE — Progress Notes (Addendum)
   Subjective:    Patient ID: Amy Clayton, female    DOB: Nov 14, 1960, 54 y.o.   MRN: [id number]  HPI Referred to our office for possible colonoscopy to rule out an inflammatory bowel disease.  Patient underwent an I and D peri-rectal abscess 12/12/2013 by Dr Arnoldo Morale. She has had symptoms for over a year. She also underwent surgery by Dr. Lenon Curt for a fistula in ano in December of 2015. Biopsy revealed  Benign soft tissue with inflammation She tells me the perineal areahas not healed. Small amt of drainage noted she says. Her BMs are normal. NO mucous from her BMs.  She has never undergone a colonoscopy in the past.  No hx of Inflammatory bowel disease in her family. She does have some nagging left lower abdominal burning sensation off an on. Appetite is good. No weight loss. She usually has a BM x 1 a day.  She is not sexually active   Hx of partial hysterectomy for "infection" in her 70s.        Review of Systems     Past Medical History  Diagnosis Date  . Hyperlipidemia   . Cardiac murmur   . Upper respiratory infection   . Hypertension     Past Surgical History  Procedure Laterality Date  . Laparoscopic salpingoopherectomy Right 1985  . Tonsillectomy    . Incision and drainage abscess N/A 12/12/2013    Procedure: INCISION AND DRAINAGE PERIRECTAL  ABSCESS;  Surgeon: Jamesetta So, MD;  Location: [facility];  Service: General;  Laterality: N/A;  . Mass excision N/A 03/14/2014    Procedure: EXCISION OF VULVAR FIBROSIS; EXCISION LEFT GLUTEAL FISTULA  IN ANO;  Surgeon: Jonnie Kind, MD;  Location: [facility];  Service: Gynecology;  Laterality: N/A;    Allergies  Allergen Reactions  . Penicillins Rash    Current Outpatient Prescriptions on File Prior to Visit  Medication Sig Dispense Refill  . aspirin 325 MG tablet Take 325 mg by mouth daily as needed.    . gabapentin (NEURONTIN) 250 MG/5ML solution Take 250 mg by mouth 3 (three) times daily.    Marland Kitchen ibuprofen  (ADVIL,MOTRIN) 200 MG tablet Take 200 mg by mouth daily as needed (swelling).    Marland Kitchen olmesartan-hydrochlorothiazide (BENICAR HCT) 20-12.5 MG per tablet Take 1 tablet by mouth daily.    . rosuvastatin (CRESTOR) 10 MG tablet Take 10 mg by mouth daily.       No current facility-administered medications on file prior to visit.     Objective:   Physical Exam Filed Vitals:   05/24/14 1415  Height: 4\' 10"  (1.473 m)  Weight: 132 lb 6.4 oz (60.056 kg)   Alert and oriented. Skin warm and dry. Oral mucosa is moist.   . Sclera anicteric, conjunctivae is pink. Thyroid not enlarged. No cervical lymphadenopathy. Lungs clear. Heart regular rate and rhythm. Murmur heard.   Abdomen is soft. Bowel sounds are positive. No hepatomegaly. No abdominal masses felt. No tenderness.  No edema to lower extremities.  Perineal area: slightly reddened area to peri-rectal area. No drainage. No open areas that I can tell to vagina.       Assessment & Plan:  Hx of peri-rectal abscess. Patient has never undergone a colonoscopy in the past. Will rule out Crohn's disease. Concern for inflammatory bowel disease. Will schedule a colonoscopy with Dr Laural Golden. Patient does not have any GI symptoms. She is overdue for a colonoscopy. This is also for screening purposes. CRP today.

## 2014-05-24 NOTE — Telephone Encounter (Signed)
Patient needs movi prep 

## 2014-05-24 NOTE — Patient Instructions (Signed)
Colonoscopy. The risks and benefits such as perforation, bleeding, and infection were reviewed with the patient and is agreeable.The risks and benefits such as perforation, bleeding, and infection were reviewed with the patient and is agreeable. 

## 2014-06-01 ENCOUNTER — Encounter (HOSPITAL_COMMUNITY)
Admission: RE | Disposition: A | Discharge status: Home / Self Care | Payer: Self-pay | Source: Ambulatory Visit | Attending: Internal Medicine

## 2014-06-01 ENCOUNTER — Ambulatory Visit (HOSPITAL_COMMUNITY)
Admission: RE | Admit: 2014-06-01 | Discharge: 2014-06-01 | Disposition: A | Discharge status: Home / Self Care | Payer: BLUE CROSS/BLUE SHIELD | Source: Ambulatory Visit | Attending: Internal Medicine | Admitting: Internal Medicine

## 2014-06-01 ENCOUNTER — Encounter (HOSPITAL_COMMUNITY): Payer: Self-pay | Admitting: *Deleted

## 2014-06-01 DIAGNOSIS — I10 Essential (primary) hypertension: Secondary | ICD-10-CM | POA: Insufficient documentation

## 2014-06-01 DIAGNOSIS — K603 Anal fistula: Secondary | ICD-10-CM

## 2014-06-01 DIAGNOSIS — Z7982 Long term (current) use of aspirin: Secondary | ICD-10-CM | POA: Diagnosis not present

## 2014-06-01 DIAGNOSIS — D123 Benign neoplasm of transverse colon: Secondary | ICD-10-CM | POA: Diagnosis not present

## 2014-06-01 DIAGNOSIS — K644 Residual hemorrhoidal skin tags: Secondary | ICD-10-CM | POA: Diagnosis not present

## 2014-06-01 DIAGNOSIS — K5730 Diverticulosis of large intestine without perforation or abscess without bleeding: Secondary | ICD-10-CM | POA: Diagnosis not present

## 2014-06-01 DIAGNOSIS — E785 Hyperlipidemia, unspecified: Secondary | ICD-10-CM | POA: Diagnosis not present

## 2014-06-01 DIAGNOSIS — K610 Anal abscess: Secondary | ICD-10-CM

## 2014-06-01 DIAGNOSIS — K629 Disease of anus and rectum, unspecified: Secondary | ICD-10-CM | POA: Diagnosis not present

## 2014-06-01 DIAGNOSIS — K648 Other hemorrhoids: Secondary | ICD-10-CM

## 2014-06-01 DIAGNOSIS — K635 Polyp of colon: Secondary | ICD-10-CM | POA: Diagnosis present

## 2014-06-01 DIAGNOSIS — K6289 Other specified diseases of anus and rectum: Secondary | ICD-10-CM

## 2014-06-01 DIAGNOSIS — Z87891 Personal history of nicotine dependence: Secondary | ICD-10-CM | POA: Insufficient documentation

## 2014-06-01 DIAGNOSIS — Z880 Allergy status to penicillin: Secondary | ICD-10-CM | POA: Diagnosis not present

## 2014-06-01 DIAGNOSIS — F1099 Alcohol use, unspecified with unspecified alcohol-induced disorder: Secondary | ICD-10-CM | POA: Diagnosis not present

## 2014-06-01 HISTORY — DX: Other specified postprocedural states: Z98.890

## 2014-06-01 HISTORY — PX: COLONOSCOPY: SHX5424

## 2014-06-01 HISTORY — DX: Other specified postprocedural states: R11.2

## 2014-06-01 SURGERY — COLONOSCOPY
Anesthesia: Moderate Sedation

## 2014-06-01 MED ORDER — STERILE WATER FOR IRRIGATION IR SOLN
Status: DC | PRN
  Administered 2014-06-01: 13:00:00

## 2014-06-01 MED ORDER — SODIUM CHLORIDE 0.9 % IV SOLN: INTRAVENOUS | Status: DC

## 2014-06-01 MED ORDER — MEPERIDINE HCL 50 MG/ML IJ SOLN
INTRAMUSCULAR | Status: DC | PRN
  Administered 2014-06-01 (×2): 25 mg via INTRAVENOUS

## 2014-06-01 MED ORDER — MIDAZOLAM HCL 5 MG/5ML IJ SOLN
INTRAMUSCULAR | Status: DC | PRN
  Administered 2014-06-01 (×2): 2 mg via INTRAVENOUS
  Administered 2014-06-01: 1 mg via INTRAVENOUS
  Administered 2014-06-01: 2 mg via INTRAVENOUS
  Administered 2014-06-01: 1 mg via INTRAVENOUS

## 2014-06-01 MED ORDER — MEPERIDINE HCL 50 MG/ML IJ SOLN
INTRAMUSCULAR | Status: AC
  Filled 2014-06-01: qty 1

## 2014-06-01 MED ORDER — LIDOCAINE HCL 2 % EX GEL
CUTANEOUS | Status: AC
  Filled 2014-06-01: qty 30

## 2014-06-01 MED ORDER — MIDAZOLAM HCL 5 MG/5ML IJ SOLN
INTRAMUSCULAR | Status: AC
  Filled 2014-06-01: qty 10

## 2014-06-01 MED ORDER — LIDOCAINE HCL 2 % EX GEL
CUTANEOUS | Status: DC | PRN
  Administered 2014-06-01: 1

## 2014-06-01 NOTE — Op Note (Signed)
COLONOSCOPY PROCEDURE REPORT  PATIENT:  Amy Clayton  MR#:  [id number] Birthdate:  12/12/1960, 54 y.o., female Endoscopist:  Dr. Rogene Houston, MD  Procedure Date: 06/01/2014  Procedure:   Colonoscopy with terminal ileoscopy  Indications:  Patient is 54 year old Caucasian female who has history of perirectal abscess for which she had I and D in August last year and in November she was evaluated for vaginal discharge and noted have fistula in ano.  Informed Consent:  The procedure and risks were reviewed with the patient and informed consent was obtained.  Medications:  Demerol 50 mg IV Versed 8 mg IV  Description of procedure:  After a digital rectal exam was performed, that colonoscope was advanced from the anus through the rectum and colon to the area of the cecum, ileocecal valve and appendiceal orifice. The cecum was deeply intubated. These structures were well-seen and photographed for the record. From the level of the cecum and ileocecal valve, the scope was slowly and cautiously withdrawn. The mucosal surfaces were carefully surveyed utilizing scope tip to flexion to facilitate fold flattening as needed. The scope was pulled down into the rectum where a thorough exam including retroflexion was performed. Terminal ileum was also examined.  Findings:   Prep excellent. Normal mucosa of terminal ileum. Single small polyp ablated via cold biopsy from hepatic flexure. Single small diverticulum at splenic flexure. Normal rectal mucosa. Two large anal papillae and hemorrhoids below the dentate line.   Therapeutic/Diagnostic Maneuvers Performed:  See above  Complications:  None  Cecal Withdrawal Time:  6  minutes  Impression:  Normal mucosa of terminal ileum. Small polyp ablated via cold biopsy from hepatic flexure. Single small diverticulum at splenic flexure. No evidence of proctitis. Two large anal papillae and external hemorrhoids.   Comments; No abnormality noted  on this exam to suggest Crohn's disease. Further evaluation would be undertaken with pelvic MRI.  Recommendations:  Standard instructions given. I will contact patient with biopsy results and further recommendations. Pelvic MRI to be scheduled.  Nikola Blackston U  06/01/2014 1:16 PM  CC: Dr. Delphina Cahill, MD & Dr. Rayne Du ref. provider found CC: Dr. Jonnie Kind, MD

## 2014-06-01 NOTE — Discharge Instructions (Signed)
Resume usual medications and diet. No driving for 24 hours. Pelvic MRI to be scheduled.  Colonoscopy, Care After Refer to this sheet in the next few weeks. These instructions provide you with information on caring for yourself after your procedure. Your health care provider may also give you more specific instructions. Your treatment has been planned according to current medical practices, but problems sometimes occur. Call your health care provider if you have any problems or questions after your procedure. WHAT TO EXPECT AFTER THE PROCEDURE  After your procedure, it is typical to have the following:  A small amount of blood in your stool.  Moderate amounts of gas and mild abdominal cramping or bloating. HOME CARE INSTRUCTIONS  Do not drive, operate machinery, or sign important documents for 24 hours.  You may shower and resume your regular physical activities, but move at a slower pace for the first 24 hours.  Take frequent rest periods for the first 24 hours.  Walk around or put a warm pack on your abdomen to help reduce abdominal cramping and bloating.  Drink enough fluids to keep your urine clear or pale yellow.  You may resume your normal diet as instructed by your health care provider. Avoid heavy or fried foods that are hard to digest.  Avoid drinking alcohol for 24 hours or as instructed by your health care provider.  Only take over-the-counter or prescription medicines as directed by your health care provider.  If a tissue sample (biopsy) was taken during your procedure:  Do not take aspirin or blood thinners for 7 days, or as instructed by your health care provider.  Do not drink alcohol for 7 days, or as instructed by your health care provider.  Eat soft foods for the first 24 hours. SEEK MEDICAL CARE IF: You have persistent spotting of blood in your stool 2-3 days after the procedure. SEEK IMMEDIATE MEDICAL CARE IF:  You have more than a small spotting of blood  in your stool.  You pass large blood clots in your stool.  Your abdomen is swollen (distended).  You have nausea or vomiting.  You have a fever.  You have increasing abdominal pain that is not relieved with medicine. Document Released: 11/20/2003 Document Revised: 01/26/2013 Document Reviewed: 12/13/2012 [facility] Patient Information 2015 [place], [place]. This information is not intended to replace advice given to you by your health care provider. Make sure you discuss any questions you have with your health care provider.

## 2014-06-01 NOTE — H&P (Addendum)
Amy Clayton is an 54 y.o. female.   Chief Complaint: Patient is here for colonoscopy. HPI: Patient is 54 year old Caucasian female who underwent I&D for recurrent perirectal abscess in August 2015. She then developed vaginal discharge and was evaluated by Dr. 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 Therefore concern was raised if she had Crohn's disease. She is therefore undergoing diagnostic examination. She denies diarrhea constipation rectal bleeding. She has good appetite and has not lost any weight. She still has scant vaginal discharge.  Family history is negative for CRC.  Past Medical History  Diagnosis Date  . Hyperlipidemia   . Cardiac murmur   . Upper respiratory infection   . Hypertension   . PONV (postoperative nausea and vomiting)     Past Surgical History  Procedure Laterality Date  . Laparoscopic salpingoopherectomy Right 1985  . Tonsillectomy    . Incision and drainage abscess N/A 12/12/2013    Procedure: INCISION AND DRAINAGE PERIRECTAL  ABSCESS;  Surgeon: Jamesetta So, MD;  Location: [facility];  Service: General;  Laterality: N/A;  . Mass excision N/A 03/14/2014    Procedure: EXCISION OF VULVAR FIBROSIS; EXCISION LEFT GLUTEAL FISTULA  IN ANO;  Surgeon: Jonnie Kind, MD;  Location: [facility];  Service: Gynecology;  Laterality: N/A;    Family History  Problem Relation Age of Onset  . Coronary artery disease Brother   . Hypertension Brother   . Hyperlipidemia Brother   . Coronary artery disease Other     cousins  . COPD Mother   . Cancer Mother   . Heart disease Father   . Hypertension Sister   . Hyperlipidemia Sister   . Early death Sister   . Heart murmur Sister   . Hypertension Brother   . Hyperlipidemia Brother    Social History:  reports that she quit smoking about 5 years ago. Her smoking use included Cigarettes. She has a 25 pack-year smoking history. She has never used smokeless tobacco. She reports that she drinks  about 8.4 oz of alcohol per week. She reports that she does not use illicit drugs.  Allergies:  Allergies  Allergen Reactions  . Penicillins Rash    Medications Prior to Admission  Medication Sig Dispense Refill  . gabapentin (NEURONTIN) 250 MG/5ML solution Take 250 mg by mouth 3 (three) times daily.    Marland Kitchen olmesartan-hydrochlorothiazide (BENICAR HCT) 20-12.5 MG per tablet Take 1 tablet by mouth daily.    . peg 3350 powder (MOVIPREP) 100 G SOLR Take 1 kit (200 g total) by mouth once. 1 kit 0  . aspirin 325 MG tablet Take 325 mg by mouth daily as needed for mild pain.     Marland Kitchen ibuprofen (ADVIL,MOTRIN) 200 MG tablet Take 200 mg by mouth daily as needed (swelling).      No results found for this or any previous visit (from the past 48 hour(s)). No results found.  ROS  Blood pressure 133/71, pulse 65, temperature 98.4 F (36.9 C), temperature source Oral, resp. rate 19, height _0  (1.473 m), weight 130 lb (58.968 kg), SpO2 100 %. Physical Exam  Constitutional: She appears well-developed and well-nourished.  HENT:  Mouth/Throat: Oropharynx is clear and moist.  Eyes: Conjunctivae are normal. No scleral icterus.  Neck: No thyromegaly present.  Cardiovascular: Regular rhythm.   No murmur heard. Respiratory: Effort normal and breath sounds normal.  GI: Soft. She exhibits no distension and no mass. There is no tenderness.  Musculoskeletal: She exhibits no  edema.  Lymphadenopathy:    She has no cervical adenopathy.  Neurological: She is alert.  Skin: Skin is warm and dry.     Assessment/Plan History of perirectal abscess and rectovaginal fistula. ? Crohn's disease. Diagnostic colonoscopy.  REHMAN,NAJEEB U 06/01/2014, 12:35 PM

## 2014-06-02 ENCOUNTER — Encounter (HOSPITAL_COMMUNITY): Payer: Self-pay | Admitting: Internal Medicine

## 2014-06-06 ENCOUNTER — Other Ambulatory Visit (INDEPENDENT_AMBULATORY_CARE_PROVIDER_SITE_OTHER): Payer: Self-pay | Admitting: Internal Medicine

## 2014-06-06 DIAGNOSIS — K603 Anal fistula: Secondary | ICD-10-CM

## 2014-06-07 ENCOUNTER — Encounter (INDEPENDENT_AMBULATORY_CARE_PROVIDER_SITE_OTHER): Payer: Self-pay | Admitting: *Deleted

## 2014-06-14 ENCOUNTER — Ambulatory Visit (HOSPITAL_COMMUNITY)
Admission: RE | Admit: 2014-06-14 | Discharge: 2014-06-14 | Disposition: A | Discharge status: Home / Self Care | Payer: BLUE CROSS/BLUE SHIELD | Source: Ambulatory Visit | Attending: Internal Medicine | Admitting: Internal Medicine

## 2014-06-14 DIAGNOSIS — K603 Anal fistula: Secondary | ICD-10-CM | POA: Insufficient documentation

## 2014-06-14 LAB — POCT I-STAT CREATININE: Creatinine, Ser: 0.8 mg/dL (ref 0.50–1.10)

## 2014-06-14 MED ORDER — GADOBENATE DIMEGLUMINE 529 MG/ML IV SOLN
13.0000 mL | Freq: Once | INTRAVENOUS | Status: AC | PRN
  Administered 2014-06-14: 13 mL via INTRAVENOUS

## 2014-06-21 ENCOUNTER — Other Ambulatory Visit (INDEPENDENT_AMBULATORY_CARE_PROVIDER_SITE_OTHER): Payer: Self-pay | Admitting: Internal Medicine

## 2014-06-21 MED ORDER — METRONIDAZOLE 250 MG PO TABS: 250.0000 mg | ORAL_TABLET | Freq: Three times a day (TID) | ORAL | Status: DC

## 2014-06-30 ENCOUNTER — Telehealth (INDEPENDENT_AMBULATORY_CARE_PROVIDER_SITE_OTHER): Payer: Self-pay | Admitting: *Deleted

## 2014-06-30 NOTE — Telephone Encounter (Signed)
Patient advised to discontinue Flagyl. patient is examination under general anesthesia.  She will call Dr. Arnoldo Morale office next week and make an appointment.

## 2014-06-30 NOTE — Telephone Encounter (Signed)
Patient wants to know if she can stop flagyl, states it's not working, also been having heart palpitation, not sure if from flagyl or blood pressure med or combination -- cell# [phone]

## 2014-07-12 NOTE — Patient Instructions (Signed)
Amy Clayton Round  07/12/2014   Your procedure is scheduled on:  07/17/2014  Report to [facility] at  38  AM.  Call this number if you have problems the morning of surgery: [phone]   Remember:   Do not eat food or drink liquids after midnight.   Take these medicines the morning of surgery with A SIP OF WATER:  Neurontin, benicar   Do not wear jewelry, make-up or nail polish.  Do not wear lotions, powders, or perfumes.   Do not shave 48 hours prior to surgery. Men may shave face and neck.  Do not bring valuables to the hospital.  [facility] is not responsible for any belongings or valuables.               Contacts, dentures or bridgework may not be worn into surgery.  Leave suitcase in the car. After surgery it may be brought to your room.  For patients admitted to the hospital, discharge time is determined by your treatment team.               Patients discharged the day of surgery will not be allowed to drive home.  Name and phone number of your driver: family  Special Instructions: Shower using CHG 2 nights before surgery and the night before surgery.  If you shower the day of surgery use CHG.  Use special wash - you have one bottle of CHG for all showers.  You should use approximately 1/3 of the bottle for each shower.   Please read over the following fact sheets that you were given: Pain Booklet, Coughing and Deep Breathing, Surgical Site Infection Prevention, Anesthesia Post-op Instructions and Care and Recovery After Surgery Anal Fistula An anal fistula is an abnormal tunnel that develops between the bowel and skin near the outside of the anus, where feces comes out. The anus has a number of tiny glands that make lubricating fluid. Sometimes these glands can become plugged and infected. This may lead to the development of a fluid-filled pocket (abscess). An anal fistula often develops after this infection or abscess. It is nearly always caused by a past or current anal  abscess.  CAUSES  Though an anal fistula is almost always caused by a past or current anal abscess, other causes can include:  A complication of surgery.  Trauma to the rectal area.  Radiation to the area.  Other medical conditions or diseases, such as:   Chronic inflammatory bowel disease, such as Crohn disease or ulcerative colitis.   Colon or rectal cancer.   Diverticular disease, such as diverticulitis.   A sexually transmitted disease, such as gonorrhea, chlamydia, or syphilis.  An HIV infection or AIDS.  SYMPTOMS   Throbbing or constant pain that may be worse when sitting.   Swelling or irritation around the anus.   Drainage of pus or blood from an opening near the anus.   Pain with bowel movements.  Fever or chills. DIAGNOSIS  Your caregiver will examine the area to find the openings of the anal fistula and the fistula tract. The external opening of the anal fistula may be seen during a physical examination. Other examinations that may be performed include:   Examination of the rectal area with a gloved hand (digital rectal exam).   Examination with a probe or scope to help locate the internal opening of the fistula.   Injection of a dye into the fistula opening. X-rays can be taken  to find the exact location and path of the fistula.   An MRI or ultrasound of the anal area.  Other tests may be performed to find the cause of the anal fistula.   TREATMENT  The most common treatment for an anal fistula is surgery. There are different surgery options depending on where your fistula is located and how complex the fistula is. Surgical options include:  A fistulotomy. This surgery involves opening up the whole fistula and draining the contents inside to promote healing.  Seton placement. A silk string (seton) is placed into the fistula during a fistulotomy to drain any infection to promote healing.  Advancement flap procedure. Tissue is removed from your  rectum or the skin around the anus and is attached to the opening of the fistula.  Bioprosthetic plug. A cone-shaped plug is made from your tissue and is used to block the opening of the fistula. Some anal fistulas do not require surgery. A fibrin glue is a non-surgical option that involves injecting the glue to seal the fistula. You also may be prescribed an antibiotic medicine to treat an infection.  HOME CARE INSTRUCTIONS   Take your antibiotics as directed. Finish them even if you start to feel better.  Only take over-the-counter or prescription medicines as directed by your caregiver.Use a stool softener or laxative, if recommended.   Eat a high-fiber diet to help avoid constipation or as directed by your caregiver.  Drink enough water to keep your urine clear or pale yellow.   A warm sitz bath may be soothing and help with healing. You may take warm sitz baths for 15-20 minutes, 3-4 times a day to ease pain and discomfort.   Follow excellent hygiene to keep the anal area as clean and dry as possible. Use wet toilet paper or moist towelettes after each bowel movement.  SEEK MEDICAL CARE IF: You have increased pain not controlled with medicines.  SEEK IMMEDIATE MEDICAL CARE IF:  You have severe, intolerable pain.  You have new swelling, redness, or discharge around the anal area.  You have tenderness or warmth around the anal area.  You have chills or diarrhea.  You have severe problems urinating or having a bowel movement.   You have a fever or persistent symptoms for more than 2-3 days.   You have a fever and your symptoms suddenly get worse.  MAKE SURE YOU:   Understand these instructions.  Will watch your condition.  Will get help right away if you are not doing well or get worse. Document Released: 03/20/2008 Document Revised: 03/24/2012 Document Reviewed: 02/10/2011 [facility] Patient Information 2015 [employer] [place], [place]. This information is not intended to  replace advice given to you by your health care provider. Make sure you discuss any questions you have with your health care provider. PATIENT INSTRUCTIONS POST-ANESTHESIA  IMMEDIATELY FOLLOWING SURGERY:  Do not drive or operate machinery for the first twenty four hours after surgery.  Do not make any important decisions for twenty four hours after surgery or while taking narcotic pain medications or sedatives.  If you develop intractable nausea and vomiting or a severe headache please notify your doctor immediately.  FOLLOW-UP:  Please make an appointment with your surgeon as instructed. You do not need to follow up with anesthesia unless specifically instructed to do so.  WOUND CARE INSTRUCTIONS (if applicable):  Keep a dry clean dressing on the anesthesia/puncture wound site if there is drainage.  Once the wound has quit draining you may leave it  open to air.  Generally you should leave the bandage intact for twenty four hours unless there is drainage.  If the epidural site drains for more than 36-48 hours please call the anesthesia department.  QUESTIONS?:  Please feel free to call your physician or the hospital operator if you have any questions, and they will be happy to assist you.

## 2014-07-12 NOTE — H&P (Signed)
Amy Clayton is an 54 y.o. female.    Chief Complaint: Sinus drainage, peritoneum HPI: Patient is a 53 year old white female status post incision and drainage of perirectal abscess as well as excision of left vulvar fistula in 2015 who presents with a chronically draining opening at the vaginal orifice. She has had an MRI which is negative for a rectovaginal fistula. She now presents for examination under anesthesia for possible excision of residual fistulous tract.  Past Medical History  Diagnosis Date  . Hyperlipidemia   . Cardiac murmur   . Upper respiratory infection   . Hypertension   . PONV (postoperative nausea and vomiting)     Past Surgical History  Procedure Laterality Date  . Laparoscopic salpingoopherectomy Right 1985  . Tonsillectomy    . Incision and drainage abscess N/A 12/12/2013    Procedure: INCISION AND DRAINAGE PERIRECTAL  ABSCESS;  Surgeon: Jamesetta So, MD;  Location: [facility] ORS;  Service: General;  Laterality: N/A;  . Mass excision N/A 03/14/2014    Procedure: EXCISION OF VULVAR FIBROSIS; EXCISION LEFT GLUTEAL FISTULA  IN ANO;  Surgeon: Jonnie Kind, MD;  Location: [facility] ORS;  Service: Gynecology;  Laterality: N/A;  . Colonoscopy N/A 06/01/2014    Procedure: COLONOSCOPY;  Surgeon: Rogene Houston, MD;  Location: [facility] END[facility] SUITE;  Service: Endoscopy;  Laterality: N/A;  145 - moved to 12:45 - Ann to notify pt    Family History  Problem Relation Age of Onset  . Coronary artery disease Brother   . Hypertension Brother   . Hyperlipidemia Brother   . Coronary artery disease Other     cousins  . COPD Mother   . Cancer Mother   . Heart disease Father   . Hypertension Sister   . Hyperlipidemia Sister   . Early death Sister   . Heart murmur Sister   . Hypertension Brother   . Hyperlipidemia Brother    Social History:  reports that she quit smoking about 5 years ago. Her smoking use included Cigarettes. She has a 25 pack-year smoking history. She has never used  smokeless tobacco. She reports that she drinks about 8.4 oz of alcohol per week. She reports that she does not use illicit drugs.  Allergies:  Allergies  Allergen Reactions  . Penicillins Rash    No prescriptions prior to admission    No results found for this or any previous visit (from the past 48 hour(s)). No results found.  Review of Systems  All other systems reviewed and are negative.   There were no vitals taken for this visit. Physical Exam  Constitutional: She is oriented to person, place, and time. She appears well-developed and well-nourished.  HENT:  Head: Normocephalic and atraumatic.  Neck: Normal range of motion. Neck supple.  Cardiovascular: Normal rate, regular rhythm and normal heart sounds.   Respiratory: Effort normal and breath sounds normal.  GI: Soft. Bowel sounds are normal. There is no tenderness.  Genitourinary:  Clear yellow fluid draining from opening at the vaginal orifice. Mild induration noted in the perineum between the vagina and anus. Tender to touch.  Neurological: She is alert and oriented to person, place, and time.  Skin: Skin is warm and dry.     Assessment/Plan Impression: Draining sinus, vaginal orifice, perineum Plan: Patient be taken to the operating room for examination under anesthesia, possible fistulectomy. The risks and benefits of the procedure including recurrence of the fistula were fully explained to the patient, who gave informed consent.  Nataliya Graig A 07/12/2014, 10:06 AM

## 2014-07-13 ENCOUNTER — Encounter (HOSPITAL_COMMUNITY)
Admission: RE | Admit: 2014-07-13 | Discharge: 2014-07-13 | Disposition: A | Discharge status: Home / Self Care | Payer: BLUE CROSS/BLUE SHIELD | Source: Ambulatory Visit | Attending: General Surgery | Admitting: General Surgery

## 2014-07-13 ENCOUNTER — Encounter (HOSPITAL_COMMUNITY): Payer: Self-pay

## 2014-07-13 LAB — CBC WITH DIFFERENTIAL/PLATELET
Basophils Absolute: 0 10*3/uL (ref 0.0–0.1)
Basophils Relative: 1 % (ref 0–1)
EOS ABS: 0.1 10*3/uL (ref 0.0–0.7)
EOS PCT: 2 % (ref 0–5)
HEMATOCRIT: 43.9 % (ref 36.0–46.0)
HEMOGLOBIN: 15.1 g/dL — AB (ref 12.0–15.0)
LYMPHS PCT: 49 % — AB (ref 12–46)
Lymphs Abs: 3.3 10*3/uL (ref 0.7–4.0)
MCH: 30.1 pg (ref 26.0–34.0)
MCHC: 34.4 g/dL (ref 30.0–36.0)
MCV: 87.5 fL (ref 78.0–100.0)
MONOS PCT: 7 % (ref 3–12)
Monocytes Absolute: 0.4 10*3/uL (ref 0.1–1.0)
Neutro Abs: 2.7 10*3/uL (ref 1.7–7.7)
Neutrophils Relative %: 41 % — ABNORMAL LOW (ref 43–77)
PLATELETS: 299 10*3/uL (ref 150–400)
RBC: 5.02 MIL/uL (ref 3.87–5.11)
RDW: 13.3 % (ref 11.5–15.5)
WBC: 6.5 10*3/uL (ref 4.0–10.5)

## 2014-07-13 LAB — BASIC METABOLIC PANEL
ANION GAP: 9 (ref 5–15)
BUN: 11 mg/dL (ref 6–23)
CO2: 30 mmol/L (ref 19–32)
Calcium: 10.2 mg/dL (ref 8.4–10.5)
Chloride: 102 mmol/L (ref 96–112)
Creatinine, Ser: 0.7 mg/dL (ref 0.50–1.10)
GFR calc Af Amer: >90 mL/min (ref >90)
GLUCOSE: 93 mg/dL (ref 70–99)
POTASSIUM: 4.1 mmol/L (ref 3.5–5.1)
SODIUM: 141 mmol/L (ref 135–145)

## 2014-07-17 ENCOUNTER — Ambulatory Visit (HOSPITAL_COMMUNITY): Payer: BLUE CROSS/BLUE SHIELD | Admitting: Anesthesiology

## 2014-07-17 ENCOUNTER — Ambulatory Visit (HOSPITAL_COMMUNITY)
Admission: RE | Admit: 2014-07-17 | Discharge: 2014-07-17 | Disposition: A | Discharge status: Home / Self Care | Payer: BLUE CROSS/BLUE SHIELD | Source: Ambulatory Visit | Attending: General Surgery | Admitting: General Surgery

## 2014-07-17 ENCOUNTER — Encounter (HOSPITAL_COMMUNITY)
Admission: RE | Disposition: A | Discharge status: Home / Self Care | Payer: Self-pay | Source: Ambulatory Visit | Attending: General Surgery

## 2014-07-17 DIAGNOSIS — Z87891 Personal history of nicotine dependence: Secondary | ICD-10-CM | POA: Insufficient documentation

## 2014-07-17 DIAGNOSIS — N823 Fistula of vagina to large intestine: Secondary | ICD-10-CM | POA: Diagnosis present

## 2014-07-17 DIAGNOSIS — I10 Essential (primary) hypertension: Secondary | ICD-10-CM | POA: Insufficient documentation

## 2014-07-17 DIAGNOSIS — E785 Hyperlipidemia, unspecified: Secondary | ICD-10-CM | POA: Insufficient documentation

## 2014-07-17 DIAGNOSIS — Z880 Allergy status to penicillin: Secondary | ICD-10-CM | POA: Insufficient documentation

## 2014-07-17 HISTORY — PX: EXAMINATION UNDER ANESTHESIA: SHX1540

## 2014-07-17 SURGERY — EXAM UNDER ANESTHESIA
Anesthesia: General

## 2014-07-17 MED ORDER — FENTANYL CITRATE 0.05 MG/ML IJ SOLN
INTRAMUSCULAR | Status: DC | PRN
  Administered 2014-07-17: 50 ug via INTRAVENOUS
  Administered 2014-07-17 (×2): 25 ug via INTRAVENOUS

## 2014-07-17 MED ORDER — LACTATED RINGERS IV SOLN
INTRAVENOUS | Status: DC
  Administered 2014-07-17: 1000 mL via INTRAVENOUS

## 2014-07-17 MED ORDER — CIPROFLOXACIN IN D5W 400 MG/200ML IV SOLN
400.0000 mg | INTRAVENOUS | Status: AC
  Administered 2014-07-17: 400 mg via INTRAVENOUS
  Filled 2014-07-17: qty 200

## 2014-07-17 MED ORDER — FENTANYL CITRATE 0.05 MG/ML IJ SOLN: 25.0000 ug | INTRAMUSCULAR | Status: DC | PRN

## 2014-07-17 MED ORDER — FENTANYL CITRATE 0.05 MG/ML IJ SOLN
INTRAMUSCULAR | Status: AC
Start: 2014-07-17 — End: 2014-07-17
  Filled 2014-07-17: qty 2

## 2014-07-17 MED ORDER — DEXAMETHASONE SODIUM PHOSPHATE 4 MG/ML IJ SOLN
INTRAMUSCULAR | Status: AC
  Filled 2014-07-17: qty 1

## 2014-07-17 MED ORDER — SCOPOLAMINE 1 MG/3DAYS TD PT72
1.0000 | MEDICATED_PATCH | Freq: Once | TRANSDERMAL | Status: DC
  Administered 2014-07-17: 1.5 mg via TRANSDERMAL

## 2014-07-17 MED ORDER — ONDANSETRON HCL 4 MG/2ML IJ SOLN: 4.0000 mg | Freq: Once | INTRAMUSCULAR | Status: DC | PRN

## 2014-07-17 MED ORDER — BUPIVACAINE HCL (PF) 0.5 % IJ SOLN
INTRAMUSCULAR | Status: AC
  Filled 2014-07-17: qty 30

## 2014-07-17 MED ORDER — MIDAZOLAM HCL 2 MG/2ML IJ SOLN
INTRAMUSCULAR | Status: AC
  Filled 2014-07-17: qty 2

## 2014-07-17 MED ORDER — PROPOFOL 10 MG/ML IV BOLUS
INTRAVENOUS | Status: DC | PRN
  Administered 2014-07-17: 30 mg via INTRAVENOUS
  Administered 2014-07-17: 150 mg via INTRAVENOUS
  Administered 2014-07-17: 20 mg via INTRAVENOUS

## 2014-07-17 MED ORDER — 0.9 % SODIUM CHLORIDE (POUR BTL) OPTIME
TOPICAL | Status: DC | PRN
  Administered 2014-07-17: 1000 mL

## 2014-07-17 MED ORDER — SCOPOLAMINE 1 MG/3DAYS TD PT72
MEDICATED_PATCH | TRANSDERMAL | Status: AC
  Filled 2014-07-17: qty 1

## 2014-07-17 MED ORDER — MIDAZOLAM HCL 2 MG/2ML IJ SOLN
1.0000 mg | INTRAMUSCULAR | Status: DC | PRN
  Administered 2014-07-17: 2 mg via INTRAVENOUS

## 2014-07-17 MED ORDER — LIDOCAINE VISCOUS 2 % MT SOLN
OROMUCOSAL | Status: AC
  Filled 2014-07-17: qty 15

## 2014-07-17 MED ORDER — FENTANYL CITRATE 0.05 MG/ML IJ SOLN
25.0000 ug | INTRAMUSCULAR | Status: AC
  Administered 2014-07-17 (×2): 25 ug via INTRAVENOUS

## 2014-07-17 MED ORDER — ONDANSETRON HCL 4 MG/2ML IJ SOLN
INTRAMUSCULAR | Status: AC
  Filled 2014-07-17: qty 2

## 2014-07-17 MED ORDER — FENTANYL CITRATE 0.05 MG/ML IJ SOLN
INTRAMUSCULAR | Status: AC
  Filled 2014-07-17: qty 2

## 2014-07-17 MED ORDER — DEXAMETHASONE SODIUM PHOSPHATE 4 MG/ML IJ SOLN
4.0000 mg | Freq: Once | INTRAMUSCULAR | Status: AC
  Administered 2014-07-17: 4 mg via INTRAVENOUS

## 2014-07-17 MED ORDER — ONDANSETRON HCL 4 MG/2ML IJ SOLN
4.0000 mg | Freq: Once | INTRAMUSCULAR | Status: AC
  Administered 2014-07-17: 4 mg via INTRAVENOUS

## 2014-07-17 MED ORDER — METRONIDAZOLE IN NACL 5-0.79 MG/ML-% IV SOLN: 500.0000 mg | INTRAVENOUS | Status: DC

## 2014-07-17 MED ORDER — CHLORHEXIDINE GLUCONATE 4 % EX LIQD: 1.0000 "application " | Freq: Once | CUTANEOUS | Status: DC

## 2014-07-17 SURGICAL SUPPLY — 21 items
BAG HAMPER (MISCELLANEOUS) ×1 IMPLANT
COVER LIGHT HANDLE STERIS (MISCELLANEOUS) ×2 IMPLANT
DECANTER SPIKE VIAL GLASS SM (MISCELLANEOUS) ×1 IMPLANT
DRAPE PROXIMA HALF (DRAPES) ×1 IMPLANT
ELECT REM PT RETURN 9FT ADLT (ELECTROSURGICAL) ×2
ELECTRODE REM PT RTRN 9FT ADLT (ELECTROSURGICAL) IMPLANT
GLOVE BIO SURGEON STRL SZ 6.5 (GLOVE) ×1 IMPLANT
GLOVE BIO SURGEON STRL SZ7.5 (GLOVE) ×1 IMPLANT
GLOVE BIOGEL PI IND STRL 7.0 (GLOVE) IMPLANT
GLOVE BIOGEL PI INDICATOR 7.0 (GLOVE) ×1
GLOVE EXAM NITRILE PF LG BLUE (GLOVE) ×1 IMPLANT
GOWN STRL REUS W/ TWL LRG LVL3 (GOWN DISPOSABLE) IMPLANT
GOWN STRL REUS W/TWL LRG LVL3 (GOWN DISPOSABLE) ×4
GOWN STRL REUS W/TWL XL LVL3 (GOWN DISPOSABLE) ×1 IMPLANT
KIT ROOM TURNOVER APOR (KITS) ×1 IMPLANT
NDL HYPO 25X1 1.5 SAFETY (NEEDLE) IMPLANT
NEEDLE HYPO 25X1 1.5 SAFETY (NEEDLE) ×2 IMPLANT
PACK PERI GYN (CUSTOM PROCEDURE TRAY) ×1 IMPLANT
PAD ARMBOARD 7.5X6 YLW CONV (MISCELLANEOUS) ×1 IMPLANT
SHEET LAVH (DRAPES) ×1 IMPLANT
SYR CONTROL 10ML LL (SYRINGE) ×1 IMPLANT

## 2014-07-17 NOTE — Transfer of Care (Signed)
Immediate Anesthesia Transfer of Care Note  Patient: Amy Clayton  Procedure(s) Performed: Procedure(s): EXAMINATION UNDER ANESTHESIA , RECTOVAGINAL (N/A)  Patient Location: PACU  Anesthesia Type:General  Level of Consciousness: sedated and patient cooperative  Airway & Oxygen Therapy: Patient Spontanous Breathing and non-rebreather face mask  Post-op Assessment: Report given to RN, Post -op Vital signs reviewed and stable and Patient moving all extremities  Post vital signs: Reviewed and stable    Complications: No apparent anesthesia complications

## 2014-07-17 NOTE — Anesthesia Preprocedure Evaluation (Signed)
Anesthesia Evaluation  Patient identified by MRN, date of birth, ID band Patient awake    Reviewed: Allergy & Precautions, H&P , NPO status , Patient's Chart, lab work & pertinent test results  History of Anesthesia Complications (+) PONV and history of anesthetic complications  Airway Mallampati: II  TM Distance: >3 FB     Dental  (+) Teeth Intact   Pulmonary former smoker,  breath sounds clear to auscultation        Cardiovascular hypertension, Pt. on medications negative cardio ROS  Rhythm:Regular Rate:Normal     Neuro/Psych    GI/Hepatic negative GI ROS,   Endo/Other    Renal/GU      Musculoskeletal   Abdominal   Peds  Hematology   Anesthesia Other Findings   Reproductive/Obstetrics                             Anesthesia Physical Anesthesia Plan  ASA: II  Anesthesia Plan: General   Post-op Pain Management:    Induction: Intravenous  Airway Management Planned: LMA  Additional Equipment:   Intra-op Plan:   Post-operative Plan: Extubation in OR  Informed Consent: I have reviewed the patients History and Physical, chart, labs and discussed the procedure including the risks, benefits and alternatives for the proposed anesthesia with the patient or authorized representative who has indicated his/her understanding and acceptance.     Plan Discussed with:   Anesthesia Plan Comments:         Anesthesia Quick Evaluation

## 2014-07-17 NOTE — Anesthesia Postprocedure Evaluation (Signed)
Anesthesia Post Note  Patient: Amy Clayton  Procedure(s) Performed: Procedure(s) (LRB): EXAMINATION UNDER ANESTHESIA , RECTOVAGINAL (N/A)  Anesthesia type: General  Patient location: PACU  Post pain: Pain level controlled  Post assessment: Post-op Vital signs reviewed, Patient's Cardiovascular Status Stable, Respiratory Function Stable, Patent Airway, No signs of Nausea or vomiting and Pain level controlled  Last Vitals:  Filed Vitals:   07/17/14 1045  BP: 141/74  Pulse: 57  Temp:   Resp: 10    Post vital signs: Reviewed and stable  Level of consciousness: awake and alert   Complications: No apparent anesthesia complications

## 2014-07-17 NOTE — Discharge Instructions (Signed)
Anal Fistula An anal fistula is an abnormal tunnel that develops between the bowel and skin near the outside of the anus, where feces comes out. The anus has a number of tiny glands that make lubricating fluid. Sometimes these glands can become plugged and infected. This may lead to the development of a fluid-filled pocket (abscess). An anal fistula often develops after this infection or abscess. It is nearly always caused by a past or current anal abscess.  CAUSES  Though an anal fistula is almost always caused by a past or current anal abscess, other causes can include:  A complication of surgery.  Trauma to the rectal area.  Radiation to the area.  Other medical conditions or diseases, such as:   Chronic inflammatory bowel disease, such as Crohn disease or ulcerative colitis.   Colon or rectal cancer.   Diverticular disease, such as diverticulitis.   A sexually transmitted disease, such as gonorrhea, chlamydia, or syphilis.  An HIV infection or AIDS.  SYMPTOMS   Throbbing or constant pain that may be worse when sitting.   Swelling or irritation around the anus.   Drainage of pus or blood from an opening near the anus.   Pain with bowel movements.  Fever or chills. DIAGNOSIS  Your caregiver will examine the area to find the openings of the anal fistula and the fistula tract. The external opening of the anal fistula may be seen during a physical examination. Other examinations that may be performed include:   Examination of the rectal area with a gloved hand (digital rectal exam).   Examination with a probe or scope to help locate the internal opening of the fistula.   Injection of a dye into the fistula opening. X-rays can be taken to find the exact location and path of the fistula.   An MRI or ultrasound of the anal area.  Other tests may be performed to find the cause of the anal fistula.   TREATMENT  The most common treatment for an anal fistula is  surgery. There are different surgery options depending on where your fistula is located and how complex the fistula is. Surgical options include:  A fistulotomy. This surgery involves opening up the whole fistula and draining the contents inside to promote healing.  Seton placement. A silk string (seton) is placed into the fistula during a fistulotomy to drain any infection to promote healing.  Advancement flap procedure. Tissue is removed from your rectum or the skin around the anus and is attached to the opening of the fistula.  Bioprosthetic plug. A cone-shaped plug is made from your tissue and is used to block the opening of the fistula. Some anal fistulas do not require surgery. A fibrin glue is a non-surgical option that involves injecting the glue to seal the fistula. You also may be prescribed an antibiotic medicine to treat an infection.  HOME CARE INSTRUCTIONS   Take your antibiotics as directed. Finish them even if you start to feel better.  Only take over-the-counter or prescription medicines as directed by your caregiver.Use a stool softener or laxative, if recommended.   Eat a high-fiber diet to help avoid constipation or as directed by your caregiver.  Drink enough water to keep your urine clear or pale yellow.   A warm sitz bath may be soothing and help with healing. You may take warm sitz baths for 15-20 minutes, 3-4 times a day to ease pain and discomfort.   Follow excellent hygiene to keep the anal area  as clean and dry as possible. Use wet toilet paper or moist towelettes after each bowel movement.  SEEK MEDICAL CARE IF: You have increased pain not controlled with medicines.  SEEK IMMEDIATE MEDICAL CARE IF:  You have severe, intolerable pain.  You have new swelling, redness, or discharge around the anal area.  You have tenderness or warmth around the anal area.  You have chills or diarrhea.  You have severe problems urinating or having a bowel movement.    You have a fever or persistent symptoms for more than 2-3 days.   You have a fever and your symptoms suddenly get worse.  MAKE SURE YOU:   Understand these instructions.  Will watch your condition.  Will get help right away if you are not doing well or get worse. Document Released: 03/20/2008 Document Revised: 03/24/2012 Document Reviewed: 02/10/2011 [facility] Patient Information 2015 [place], [place]. This information is not intended to replace advice given to you by your health care provider. Make sure you discuss any questions you have with your health care provider.

## 2014-07-17 NOTE — Op Note (Signed)
Patient:  Amy Clayton  DOB:  04-23-1960  MRN:  [id number]   Preop Diagnosis:  Possible rectovaginal fistula  Postop Diagnosis:  Rectovaginal fistula  Procedure:  Rectal examination under anesthesia  Surgeon:  Aviva Signs, M.D.  Anes:  Gen.  Indications:  Patient is a 54 year old white female who presents with chronically draining wound at her vaginal orifice. She denies any stool or air coming out through this area. She does feel a [place] not between her vagina and her anus. She now presents for possible rectovaginal fistula. A previous MRI of her pelvis was negative for fistulous tract. The risks and benefits of the procedure were fully explained to the patient, who gave informed consent.  Procedure note:  The patient was placed in the lithotomy position after general anesthesia was administered. The perineum was prepped and draped using usual sterile technique with Betadine. Surgical site confirmation was performed.  A speculum was first placed in the vagina. On inspection, a small orifice was seen at the 6:00 position of the introitus. A probe was then used and a fistulous track was found to the 12:00 position in the rectum at the dentate line. No abnormal pathology was noted on the rectal side. This was deep to the pelvic floor musculature. No other pathology was found. The external sphincter mechanism was noted to be intact. Soft tissue induration was noted around the fistulous tract.  The patient tolerated procedure well and was awakened and transferred to PACU in stable condition.  Complications:  None  EBL:  None  Specimen:  None

## 2014-07-17 NOTE — Interval H&P Note (Signed)
History and Physical Interval Note:  07/17/2014 8:55 AM  Amy Clayton  has presented today for surgery, with the diagnosis of perianal fistula  The various methods of treatment have been discussed with the patient and family. After consideration of risks, benefits and other options for treatment, the patient has consented to  Procedure(s): RECTAL EXAM UNDER ANESTHESIA  (N/A) as a surgical intervention .  The patient's history has been reviewed, patient examined, no change in status, stable for surgery.  I have reviewed the patient's chart and labs.  Questions were answered to the patient's satisfaction.     Aviva Signs A

## 2014-07-17 NOTE — Anesthesia Procedure Notes (Signed)
Procedure Name: LMA Insertion Date/Time: 07/17/2014 9:41 AM Performed by: Vista Deck Pre-anesthesia Checklist: Patient identified, Patient being monitored, Emergency Drugs available, Timeout performed and Suction available Patient Re-evaluated:Patient Re-evaluated prior to inductionOxygen Delivery Method: Circle System Utilized Preoxygenation: Pre-oxygenation with 100% oxygen Intubation Type: IV induction Ventilation: Mask ventilation without difficulty LMA: LMA inserted LMA Size: 4.0 Number of attempts: 1 Placement Confirmation: positive ETCO2 and breath sounds checked- equal and bilateral Tube secured with: Tape

## 2014-07-19 ENCOUNTER — Encounter (HOSPITAL_COMMUNITY): Payer: Self-pay | Admitting: General Surgery

## 2014-07-20 ENCOUNTER — Ambulatory Visit (INDEPENDENT_AMBULATORY_CARE_PROVIDER_SITE_OTHER): Payer: BLUE CROSS/BLUE SHIELD | Admitting: Internal Medicine

## 2014-07-28 ENCOUNTER — Encounter: Payer: Self-pay | Admitting: Obstetrics and Gynecology

## 2014-07-28 ENCOUNTER — Ambulatory Visit (INDEPENDENT_AMBULATORY_CARE_PROVIDER_SITE_OTHER): Payer: BLUE CROSS/BLUE SHIELD | Admitting: Obstetrics and Gynecology

## 2014-07-28 VITALS — BP 130/72 | Ht <= 58 in | Wt 124.0 lb

## 2014-07-28 DIAGNOSIS — N823 Fistula of vagina to large intestine: Secondary | ICD-10-CM | POA: Diagnosis not present

## 2014-07-28 NOTE — Progress Notes (Signed)
Patient ID: Amy Clayton, female   DOB: 1960/12/04, 54 y.o.   MRN: [id number] Pt here today for follow up and to discuss a few things with Dr. Glo Herring.

## 2014-07-28 NOTE — Progress Notes (Signed)
Patient ID: Amy Clayton, female   DOB: 1960/07/27, 54 y.o.   MRN: [id number]  This chart was SCRIBED for Mallory Shirk, MD by Stephania Fragmin, ED Scribe. This patient was seen in room 1 and the patient's care was started at 11:05 AM.    Amy Clayton  Patient name: Amy Clayton MRN [id number]  Date of birth: 1960-11-25  CC & HPI:  Amy Clayton is a 54 y.o. female 11 days S/P rectovaginal examination under anesthesia and approximately 5 months S/P excision of vulvar fibrosis; excision left gluteal fistula in ano. She then saw Dr Laural Golden, to consider the issue of ?Crohns, and MRI pelvis did NOT identify a fistula. Referral to Dr Arnoldo Morale  , whose EUA was able to identify a RV fistula. Pt desires excision, which will be done when it has been 6 months since original excision effort.  Dr. Arnoldo Morale' Note from 07/12/14, pre-surgery HPI: Patient is a 54 year old white female status post incision and drainage of perirectal abscess as well as excision of left vulvar fistula in 2015 who presents with a chronically draining opening at the vaginal orifice. She has had an MRI which is negative for a rectovaginal fistula. She now presents for examination under anesthesia for possible excision of residual fistulous tract.   Patient reports Dr. Arnoldo Morale states his surgery was only exploratory. She complains of feeling frustrated and depressed because her symptoms have not resolved. She has continuing discharge and mild tenderness from the area.   ROS:  A complete review of systems was obtained and all systems are negative except as noted in the HPI and PMH.    Pertinent History Reviewed:   Reviewed: Significant for laparoscopic salpingoopherectomy; excision of vulvar fibrosis, excision left gluteal fistula in ano; rectovaginal examination under anesthesia Medical         Past Medical History  Diagnosis Date  . Hyperlipidemia   . Cardiac murmur   . Upper respiratory infection   .  Hypertension   . PONV (postoperative nausea and vomiting)                               Surgical Hx:    Past Surgical History  Procedure Laterality Date  . Laparoscopic salpingoopherectomy Right 1985  . Tonsillectomy    . Incision and drainage abscess N/A 12/12/2013    Procedure: INCISION AND DRAINAGE PERIRECTAL  ABSCESS;  Surgeon: Jamesetta So, MD;  Location: [facility];  Service: General;  Laterality: N/A;  . Mass excision N/A 03/14/2014    Procedure: EXCISION OF VULVAR FIBROSIS; EXCISION LEFT GLUTEAL FISTULA  IN ANO;  Surgeon: Jonnie Kind, MD;  Location: [facility];  Service: Gynecology;  Laterality: N/A;  . Colonoscopy N/A 06/01/2014    Procedure: COLONOSCOPY;  Surgeon: Rogene Houston, MD;  Location: [facility];  Service: Endoscopy;  Laterality: N/A;  145 - moved to 12:45 - Ann to notify pt  . Examination under anesthesia N/A 07/17/2014    Procedure: EXAMINATION UNDER ANESTHESIA , RECTOVAGINAL;  Surgeon: Aviva Signs Md, MD;  Location: [facility];  Service: General;  Laterality: N/A;   Medications: Reviewed & Updated - see associated section                       Current outpatient prescriptions:  .  gabapentin (NEURONTIN) 250 MG/5ML solution, Take 250 mg by mouth 3 (three) times daily., Disp: , Rfl:  .  rosuvastatin (CRESTOR) 10 MG tablet, Take 10 mg by mouth daily., Disp: , Rfl:  .  telmisartan-hydrochlorothiazide (MICARDIS HCT) 40-12.5 MG per tablet, Take 1 tablet by mouth daily., Disp: , Rfl:    Social History: Reviewed -  reports that she quit smoking about 5 years ago. Her smoking use included Cigarettes. She has a 25 pack-year smoking history. She has never used smokeless tobacco.  Objective Findings:  Vitals: Blood pressure 130/72, height 4\' 10"  (1.473 m), weight 124 lb (56.246 kg).  Physical Examination: Patient here for discussion only. Discussion lasting about 20 minutes.  Assessment & Plan:   A:  1. 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 Plan to  schedule surgery after May, after patient goes on vacation at beginning of May 2. Schedule a follow-up in about 3 weeks for pre-op Clayton  I personally performed the services described in this documentation, which was SCRIBED in my presence. The recorded information has been reviewed and considered accurate. It has been edited as necessary during review. Jonnie Kind, MD

## 2014-08-16 ENCOUNTER — Encounter: Payer: Self-pay | Admitting: Obstetrics and Gynecology

## 2014-08-16 ENCOUNTER — Ambulatory Visit (INDEPENDENT_AMBULATORY_CARE_PROVIDER_SITE_OTHER): Payer: BLUE CROSS/BLUE SHIELD | Admitting: Obstetrics and Gynecology

## 2014-08-16 ENCOUNTER — Other Ambulatory Visit (HOSPITAL_COMMUNITY)
Admission: RE | Admit: 2014-08-16 | Discharge: 2014-08-16 | Disposition: A | Discharge status: Home / Self Care | Payer: BLUE CROSS/BLUE SHIELD | Source: Ambulatory Visit | Attending: Obstetrics and Gynecology | Admitting: Obstetrics and Gynecology

## 2014-08-16 VITALS — BP 128/70 | Ht <= 58 in | Wt 123.0 lb

## 2014-08-16 DIAGNOSIS — Z1151 Encounter for screening for human papillomavirus (HPV): Secondary | ICD-10-CM | POA: Insufficient documentation

## 2014-08-16 DIAGNOSIS — Z124 Encounter for screening for malignant neoplasm of cervix: Secondary | ICD-10-CM

## 2014-08-16 DIAGNOSIS — Z01419 Encounter for gynecological examination (general) (routine) without abnormal findings: Secondary | ICD-10-CM | POA: Diagnosis not present

## 2014-08-16 DIAGNOSIS — N823 Fistula of vagina to large intestine: Secondary | ICD-10-CM | POA: Diagnosis not present

## 2014-08-16 NOTE — Progress Notes (Signed)
Patient ID: Amy Clayton, female   DOB: [birth year], 54 y.o.   MRN: [id number]  Preoperative History and Physical  Amy Clayton is a 54 y.o. G0P0000 here for surgical management of RV fistula. She notes she has had very minimal drainage from the area lately.  No significant preoperative concerns.   Patient thinks she may be due for a pap, as her last remembered pap was done 2 years ago. About 30 years ago, patient states she was found to have severe dysplasia and had a procedure done. She has however had no problems since.    Patient's phone number: [phone]  Proposed surgery: Excision of fistula  Past Medical History  Diagnosis Date  . Hyperlipidemia   . Cardiac murmur   . Upper respiratory infection   . Hypertension   . PONV (postoperative nausea and vomiting)    Past Surgical History  Procedure Laterality Date  . Laparoscopic salpingoopherectomy Right 1985  . Tonsillectomy    . Incision and drainage abscess N/A 12/12/2013    Procedure: INCISION AND DRAINAGE PERIRECTAL  ABSCESS;  Surgeon: Jamesetta So, MD;  Location: [facility];  Service: General;  Laterality: N/A;  . Mass excision N/A 03/14/2014    Procedure: EXCISION OF VULVAR FIBROSIS; EXCISION LEFT GLUTEAL FISTULA  IN ANO;  Surgeon: Jonnie Kind, MD;  Location: [facility];  Service: Gynecology;  Laterality: N/A;  . Colonoscopy N/A 06/01/2014    Procedure: COLONOSCOPY;  Surgeon: Rogene Houston, MD;  Location: [facility];  Service: Endoscopy;  Laterality: N/A;  145 - moved to 12:45 - Ann to notify pt  . Examination under anesthesia N/A 07/17/2014    Procedure: EXAMINATION UNDER ANESTHESIA , RECTOVAGINAL;  Surgeon: Aviva Signs Md, MD;  Location: [facility];  Service: General;  Laterality: N/A;   OB History  Gravida Para Term Preterm AB SAB TAB Ectopic Multiple Living  0 0 0 0 0 0 0 0 0 0       Patient denies any other pertinent gynecologic issues.   Current Outpatient Prescriptions on File Prior to Visit  Medication Sig  Dispense Refill  . gabapentin (NEURONTIN) 250 MG/5ML solution Take 250 mg by mouth 3 (three) times daily.    . rosuvastatin (CRESTOR) 10 MG tablet Take 10 mg by mouth daily.    Marland Kitchen telmisartan-hydrochlorothiazide (MICARDIS HCT) 40-12.5 MG per tablet Take 1 tablet by mouth daily.     No current facility-administered medications on file prior to visit.   Allergies  Allergen Reactions  . Flagyl [Metronidazole] Palpitations  . Penicillins Rash    Social History:   reports that she quit smoking about 5 years ago. Her smoking use included Cigarettes. She has a 25 pack-year smoking history. She has never used smokeless tobacco. She reports that she drinks about 8.4 oz of alcohol per week. She reports that she does not use illicit drugs.  Family History  Problem Relation Age of Onset  . Coronary artery disease Brother   . Hypertension Brother   . Hyperlipidemia Brother   . Coronary artery disease Other     cousins  . COPD Mother   . Cancer Mother   . Heart disease Father   . Hypertension Sister   . Hyperlipidemia Sister   . Early death Sister   . Heart murmur Sister   . Hypertension Brother   . Hyperlipidemia Brother     Review of Systems: Noncontributory  PHYSICAL EXAM: Blood pressure 128/70, height 4\' 10"  (1.473 m), weight 123 lb (55.792  kg). General appearance - alert, well appearing, and in no distress Chest - clear to auscultation, no wheezes, rales or rhonchi, symmetric air entry Heart - normal rate and regular rhythm Abdomen - soft, nontender, nondistended, no masses or organomegaly Pelvic - Small dimple at posterior fourchette that communicates with rectum, well above the anal sphincter. Impression: tiny RV fistula  Some bleeding after passage of lacrimal duct probe, and confirming its thru passage on rectal exam. Extremities - peripheral pulses normal, no pedal edema, no clubbing or cyanosis  Labs: No results found for this or any previous visit (from the past 336  hour(s)).  Imaging Studies: No results found.  Assessment: Patient Active Problem List   Diagnosis Date Noted  . Fistula-in-ano 04/26/2014  . Vulvar cysts 02/28/2014  . MIXED HYPERLIPIDEMIA 06/26/2010  . MURMUR 06/26/2010  . CHEST PAIN UNSPECIFIED 06/26/2010  . PALPITATIONS, HX OF 06/26/2010    Plan: Patient will undergo surgical management with fistula excision to be scheduled for the first week of June.    .mec 08/16/2014 10:10 AM    This chart was SCRIBED for Amy Shirk, MD by Stephania Fragmin, ED Scribe. This patient was seen in room 1 and the patient's care was started at 10:09 AM.  I personally performed the services described in this documentation, which was SCRIBED in my presence. The recorded information has been reviewed and considered accurate. It has been edited as necessary during review. Jonnie Kind, MD

## 2014-08-16 NOTE — Progress Notes (Signed)
Patient ID: Amy Clayton, female   DOB: 11/19/1960, 54 y.o.   MRN: [id number] Pt her today for pre op exam. Pt states that she wants to schedule her surgery probably in late May. Pt wants to discuss the hernia and what to do about it.

## 2014-08-17 LAB — CYTOLOGY - PAP

## 2014-08-30 ENCOUNTER — Telehealth: Payer: Self-pay | Admitting: *Deleted

## 2014-09-01 NOTE — Telephone Encounter (Signed)
Pt describes a firm hard minimally tender knot noted under bottom of rib cage, no redness or deformity, sounds like xyphoid.. Pt to look up on web MD, if not reassured, I'll be happy to see her. Has surgery for rectocele repair in June.

## 2014-09-20 NOTE — Patient Instructions (Addendum)
    Amy Clayton  09/20/2014      Your procedure is scheduled on 09/26/14.  Report to [facility] at 08:10 A.M.  Call this number if you have problems the morning of surgery:  [phone]   Remember:  Do not eat food or drink liquids after midnight.  Take these medicines the morning of surgery with A SIP OF WATER: Gabapentin and Telmisartan-Hydrocholorthiazide   Do not wear jewelry, make-up or nail polish.  Do not wear lotions, powders, or perfumes.  You may wear deodorant.  Do not shave 48 hours prior to surgery.  Men may shave face and neck.  Do not bring valuables to the hospital.  [facility] is not responsible for any belongings or valuables.  Contacts, dentures or bridgework may not be worn into surgery.  Leave your suitcase in the car.  After surgery it may be brought to your room.  For patients admitted to the hospital, discharge time will be determined by your treatment team.  Patients discharged the day of surgery will not be allowed to drive home.    Special instructions:  Shower using Hibiclens (CHG bath) the night before surgery and the morning of surgery.  Please read over the following fact sheets that you were given. Anesthesia Post-op Instructions    PATIENT INSTRUCTIONS POST-ANESTHESIA  IMMEDIATELY FOLLOWING SURGERY:  Do not drive or operate machinery for the first twenty four hours after surgery.  Do not make any important decisions for twenty four hours after surgery or while taking narcotic pain medications or sedatives.  If you develop intractable nausea and vomiting or a severe headache please notify your doctor immediately.  FOLLOW-UP:  Please make an appointment with your surgeon as instructed. You do not need to follow up with anesthesia unless specifically instructed to do so.  WOUND CARE INSTRUCTIONS (if applicable):  Keep a dry clean dressing on the anesthesia/puncture wound site if there is drainage.  Once the wound has quit draining you may  leave it open to air.  Generally you should leave the bandage intact for twenty four hours unless there is drainage.  If the epidural site drains for more than 36-48 hours please call the anesthesia department.  QUESTIONS?:  Please feel free to call your physician or the hospital operator if you have any questions, and they will be happy to assist you.

## 2014-09-21 ENCOUNTER — Other Ambulatory Visit: Payer: Self-pay | Admitting: Obstetrics and Gynecology

## 2014-09-21 ENCOUNTER — Encounter (HOSPITAL_COMMUNITY): Payer: Self-pay

## 2014-09-21 ENCOUNTER — Encounter (HOSPITAL_COMMUNITY)
Admission: RE | Admit: 2014-09-21 | Discharge: 2014-09-21 | Disposition: A | Discharge status: Home / Self Care | Payer: BLUE CROSS/BLUE SHIELD | Source: Ambulatory Visit | Attending: Obstetrics and Gynecology | Admitting: Obstetrics and Gynecology

## 2014-09-21 ENCOUNTER — Other Ambulatory Visit: Payer: Self-pay

## 2014-09-21 ENCOUNTER — Other Ambulatory Visit (HOSPITAL_COMMUNITY): Payer: BLUE CROSS/BLUE SHIELD

## 2014-09-21 DIAGNOSIS — Z01818 Encounter for other preprocedural examination: Secondary | ICD-10-CM | POA: Insufficient documentation

## 2014-09-21 DIAGNOSIS — N823 Fistula of vagina to large intestine: Secondary | ICD-10-CM | POA: Diagnosis not present

## 2014-09-21 HISTORY — DX: Other cervical disc degeneration, unspecified cervical region: M50.30

## 2014-09-21 HISTORY — DX: Palpitations: R00.2

## 2014-09-21 LAB — CBC
HCT: 41.3 % (ref 36.0–46.0)
Hemoglobin: 13.9 g/dL (ref 12.0–15.0)
MCH: 29.6 pg (ref 26.0–34.0)
MCHC: 33.7 g/dL (ref 30.0–36.0)
MCV: 88.1 fL (ref 78.0–100.0)
PLATELETS: 280 10*3/uL (ref 150–400)
RBC: 4.69 MIL/uL (ref 3.87–5.11)
RDW: 13.4 % (ref 11.5–15.5)
WBC: 5.6 10*3/uL (ref 4.0–10.5)

## 2014-09-21 LAB — URINALYSIS, ROUTINE W REFLEX MICROSCOPIC
Bilirubin Urine: NEGATIVE
Glucose, UA: NEGATIVE mg/dL
Hgb urine dipstick: NEGATIVE
Ketones, ur: NEGATIVE mg/dL
LEUKOCYTES UA: NEGATIVE
NITRITE: NEGATIVE
PH: 5.5 (ref 5.0–8.0)
Protein, ur: NEGATIVE mg/dL
SPECIFIC GRAVITY, URINE: 1.015 (ref 1.005–1.030)
UROBILINOGEN UA: 0.2 mg/dL (ref 0.0–1.0)

## 2014-09-21 LAB — BASIC METABOLIC PANEL
Anion gap: 11 (ref 5–15)
BUN: 15 mg/dL (ref 6–20)
CALCIUM: 9.8 mg/dL (ref 8.9–10.3)
CO2: 28 mmol/L (ref 22–32)
CREATININE: 0.73 mg/dL (ref 0.44–1.00)
Chloride: 99 mmol/L — ABNORMAL LOW (ref 101–111)
GFR calc Af Amer: >60 mL/min (ref >60)
GLUCOSE: 92 mg/dL (ref 65–99)
POTASSIUM: 4.3 mmol/L (ref 3.5–5.1)
Sodium: 138 mmol/L (ref 135–145)

## 2014-09-21 LAB — TYPE AND SCREEN
ABO/RH(D): A POS
Antibody Screen: NEGATIVE

## 2014-09-25 NOTE — H&P (Signed)
Expand All Collapse All   Patient ID: Amy Clayton, female DOB: [birth year], 54 y.o. MRN: [id number]  Preoperative History and Physical  Amy Clayton is a 54 y.o. G0P0000 here for surgical management of RV fistula. She notes she has had very minimal drainage from the area lately. No significant preoperative concerns. She has had ongoing concerns over perianal and perineal infections over the past year, with I&D of perineal abscess by Dr Arnoldo Morale 11/2013, followed by recurrence of the drainage from the dimple on the left perianal area. In November 2015, I excised the suspected fibrosis, identifying a y-shaped inflammatory tract originating just above the anal sphincter, and extending into the perineal body as a tender fibrotic tract and also lateral and ending 4 cm lateral to the anus with a chronic draining fistula.   The patient had initial good healing, and the left sided fistulous tract remained intact, without drainage, but she developed a drainage site inside the introitus, a Rectovaginal fistula.  Exam under anesthesia by Dr Arnoldo Morale identified the tract , but repair has not been attempted. Evaluation by Dr Laural Golden has  Effectively ruled out inflammatory bowel disease. She is admitted for excision of the R-V fistula tract, to attempt complete resolution of the chronic draining site. She understands that no guarantees of the tissue healing can be guaranteed.   Patient thinks she may be due for a pap, as her last remembered pap was done 2 years ago. About 30 years ago, patient states she was found to have severe dysplasia and had a procedure done. She has however had no problems since. Recent Pap smear is normal 4.27.16. There has been no postmenopausal bleeding, or other gyn complaints.  Patient's phone number: [phone]  Proposed surgery: Excision of Rectovaginal fistula  Past Medical History  Diagnosis Date  . Hyperlipidemia   . Cardiac murmur   . Upper  respiratory infection   . Hypertension   . PONV (postoperative nausea and vomiting)    Past Surgical History  Procedure Laterality Date  . Laparoscopic salpingoopherectomy Right 1985  . Tonsillectomy    . Incision and drainage abscess N/A 12/12/2013    Procedure: INCISION AND DRAINAGE PERIRECTAL ABSCESS; Surgeon: Jamesetta So, MD; Location: [facility]; Service: General; Laterality: N/A;  . Mass excision N/A 03/14/2014    Procedure: EXCISION OF VULVAR FIBROSIS; EXCISION LEFT GLUTEAL FISTULA IN ANO; Surgeon: Jonnie Kind, MD; Location: [facility]; Service: Gynecology; Laterality: N/A;  . Colonoscopy N/A 06/01/2014    Procedure: COLONOSCOPY; Surgeon: Rogene Houston, MD; Location: [facility]; Service: Endoscopy; Laterality: N/A; 145 - moved to 12:45 - Ann to notify pt  . Examination under anesthesia N/A 07/17/2014    Procedure: EXAMINATION UNDER ANESTHESIA , RECTOVAGINAL; Surgeon: Aviva Signs Md, MD; Location: [facility]; Service: General; Laterality: N/A;   OB History  Gravida Para Term Preterm AB SAB TAB Ectopic Multiple Living  0 0 0 0 0 0 0 0 0 0       Patient denies any other pertinent gynecologic issues.   Current Outpatient Prescriptions on File Prior to Visit  Medication Sig Dispense Refill  . gabapentin (NEURONTIN) 250 MG/5ML solution Take 250 mg by mouth 3 (three) times daily.    . rosuvastatin (CRESTOR) 10 MG tablet Take 10 mg by mouth daily.    Marland Kitchen telmisartan-hydrochlorothiazide (MICARDIS HCT) 40-12.5 MG per tablet Take 1 tablet by mouth daily.     No current facility-administered medications on file prior to visit.   Allergies  Allergen Reactions  .  Flagyl [Metronidazole] Palpitations  . Penicillins Rash    Social History:  reports that she quit smoking about 5 years ago. Her smoking use included Cigarettes. She has a 25 pack-year smoking history. She  has never used smokeless tobacco. She reports that she drinks about 8.4 oz of alcohol per week. She reports that she does not use illicit drugs.  Family History  Problem Relation Age of Onset  . Coronary artery disease Brother   . Hypertension Brother   . Hyperlipidemia Brother   . Coronary artery disease Other     cousins  . COPD Mother   . Cancer Mother   . Heart disease Father   . Hypertension Sister   . Hyperlipidemia Sister   . Early death Sister   . Heart murmur Sister   . Hypertension Brother   . Hyperlipidemia Brother     Review of Systems: Noncontributory  PHYSICAL EXAM: Blood pressure 128/70, height 4\' 10"  (1.473 m), weight 123 lb (55.792 kg). General appearance - alert, well appearing, and in no distress Chest - clear to auscultation, no wheezes, rales or rhonchi, symmetric air entry Heart - normal rate and regular rhythm Abdomen - soft, nontender, nondistended, no masses or organomegaly Pelvic - Small dimple at posterior fourchette that communicates with rectum, well above the anal sphincter. Impression: tiny RV fistula Some bleeding after passage of lacrimal duct probe, and confirming its thru passage on rectal exam. Extremities - peripheral pulses normal, no pedal edema, no clubbing or cyanosis  Labs: No results found for this or any previous visit (from the past 336 hour(s)).  Imaging Studies:  Imaging Results    No results found.    Assessment: Patient Active Problem List   Diagnosis Date Noted  . Fistula-in-ano 04/26/2014  . Vulvar cysts 02/28/2014  . MIXED HYPERLIPIDEMIA 06/26/2010  . MURMUR 06/26/2010  . CHEST PAIN UNSPECIFIED 06/26/2010  . PALPITATIONS, HX OF 06/26/2010    Plan: Patient will undergo surgical management with fistula excision to be scheduled for the first week of June.

## 2014-09-26 ENCOUNTER — Encounter: Payer: Self-pay | Admitting: Obstetrics and Gynecology

## 2014-09-26 ENCOUNTER — Ambulatory Visit (HOSPITAL_COMMUNITY)
Admission: RE | Admit: 2014-09-26 | Discharge: 2014-09-26 | Disposition: A | Discharge status: Home / Self Care | Payer: BLUE CROSS/BLUE SHIELD | Source: Ambulatory Visit | Attending: Obstetrics and Gynecology | Admitting: Obstetrics and Gynecology

## 2014-09-26 ENCOUNTER — Encounter (HOSPITAL_COMMUNITY)
Admission: RE | Disposition: A | Discharge status: Home / Self Care | Payer: Self-pay | Source: Ambulatory Visit | Attending: Obstetrics and Gynecology

## 2014-09-26 ENCOUNTER — Ambulatory Visit (HOSPITAL_COMMUNITY): Payer: BLUE CROSS/BLUE SHIELD | Admitting: Anesthesiology

## 2014-09-26 DIAGNOSIS — Z888 Allergy status to other drugs, medicaments and biological substances status: Secondary | ICD-10-CM | POA: Insufficient documentation

## 2014-09-26 DIAGNOSIS — N823 Fistula of vagina to large intestine: Secondary | ICD-10-CM | POA: Diagnosis not present

## 2014-09-26 DIAGNOSIS — E785 Hyperlipidemia, unspecified: Secondary | ICD-10-CM | POA: Diagnosis not present

## 2014-09-26 DIAGNOSIS — I10 Essential (primary) hypertension: Secondary | ICD-10-CM | POA: Insufficient documentation

## 2014-09-26 DIAGNOSIS — Z87891 Personal history of nicotine dependence: Secondary | ICD-10-CM | POA: Insufficient documentation

## 2014-09-26 DIAGNOSIS — Z881 Allergy status to other antibiotic agents status: Secondary | ICD-10-CM | POA: Diagnosis not present

## 2014-09-26 HISTORY — PX: VESICO-VAGINAL FISTULA REPAIR: SHX5129

## 2014-09-26 SURGERY — CLOSURE, FISTULA, VESICOVAGINAL
Anesthesia: General

## 2014-09-26 MED ORDER — GLYCOPYRROLATE 0.2 MG/ML IJ SOLN
INTRAMUSCULAR | Status: DC | PRN
  Administered 2014-09-26: 0.4 mg via INTRAVENOUS

## 2014-09-26 MED ORDER — FENTANYL CITRATE (PF) 100 MCG/2ML IJ SOLN
INTRAMUSCULAR | Status: AC
  Filled 2014-09-26: qty 2

## 2014-09-26 MED ORDER — DEXAMETHASONE SODIUM PHOSPHATE 4 MG/ML IJ SOLN
4.0000 mg | Freq: Once | INTRAMUSCULAR | Status: AC
  Administered 2014-09-26: 4 mg via INTRAVENOUS

## 2014-09-26 MED ORDER — ROCURONIUM BROMIDE 100 MG/10ML IV SOLN
INTRAVENOUS | Status: DC | PRN
  Administered 2014-09-26: 30 mg via INTRAVENOUS

## 2014-09-26 MED ORDER — LIDOCAINE HCL (PF) 1 % IJ SOLN
INTRAMUSCULAR | Status: AC
  Filled 2014-09-26: qty 15

## 2014-09-26 MED ORDER — CEFAZOLIN SODIUM-DEXTROSE 2-3 GM-% IV SOLR
2.0000 g | INTRAVENOUS | Status: AC
  Administered 2014-09-26: 2 g via INTRAVENOUS

## 2014-09-26 MED ORDER — SCOPOLAMINE 1 MG/3DAYS TD PT72
MEDICATED_PATCH | TRANSDERMAL | Status: AC
  Filled 2014-09-26: qty 1

## 2014-09-26 MED ORDER — ONDANSETRON HCL 4 MG/2ML IJ SOLN: 4.0000 mg | Freq: Once | INTRAMUSCULAR | Status: DC | PRN

## 2014-09-26 MED ORDER — LIDOCAINE HCL 1 % IJ SOLN
INTRAMUSCULAR | Status: DC | PRN
  Administered 2014-09-26: 25 mg via INTRADERMAL

## 2014-09-26 MED ORDER — MIDAZOLAM HCL 2 MG/2ML IJ SOLN
INTRAMUSCULAR | Status: AC
  Filled 2014-09-26: qty 2

## 2014-09-26 MED ORDER — LACTATED RINGERS IV SOLN
INTRAVENOUS | Status: DC
  Administered 2014-09-26: 09:00:00 via INTRAVENOUS

## 2014-09-26 MED ORDER — NEOSTIGMINE METHYLSULFATE 10 MG/10ML IV SOLN
INTRAVENOUS | Status: DC | PRN
  Administered 2014-09-26 (×2): 2 mg via INTRAVENOUS
  Administered 2014-09-26: 1 mg via INTRAVENOUS

## 2014-09-26 MED ORDER — PROPOFOL 10 MG/ML IV BOLUS
INTRAVENOUS | Status: AC
  Filled 2014-09-26: qty 20

## 2014-09-26 MED ORDER — FENTANYL CITRATE (PF) 100 MCG/2ML IJ SOLN
INTRAMUSCULAR | Status: DC | PRN
  Administered 2014-09-26 (×2): 50 ug via INTRAVENOUS

## 2014-09-26 MED ORDER — CIPROFLOXACIN HCL 500 MG PO TABS: 500.0000 mg | ORAL_TABLET | Freq: Two times a day (BID) | ORAL | Status: DC

## 2014-09-26 MED ORDER — DEXAMETHASONE SODIUM PHOSPHATE 4 MG/ML IJ SOLN
INTRAMUSCULAR | Status: AC
  Filled 2014-09-26: qty 1

## 2014-09-26 MED ORDER — MIDAZOLAM HCL 2 MG/2ML IJ SOLN
1.0000 mg | INTRAMUSCULAR | Status: DC | PRN
  Administered 2014-09-26: 2 mg via INTRAVENOUS

## 2014-09-26 MED ORDER — BUPIVACAINE-EPINEPHRINE (PF) 0.5% -1:200000 IJ SOLN
INTRAMUSCULAR | Status: AC
  Filled 2014-09-26: qty 30

## 2014-09-26 MED ORDER — FENTANYL CITRATE (PF) 100 MCG/2ML IJ SOLN
25.0000 ug | INTRAMUSCULAR | Status: DC | PRN
  Administered 2014-09-26 (×2): 50 ug via INTRAVENOUS

## 2014-09-26 MED ORDER — 0.9 % SODIUM CHLORIDE (POUR BTL) OPTIME
TOPICAL | Status: DC | PRN
  Administered 2014-09-26: 1000 mL

## 2014-09-26 MED ORDER — ONDANSETRON HCL 4 MG/2ML IJ SOLN
INTRAMUSCULAR | Status: AC
  Filled 2014-09-26: qty 2

## 2014-09-26 MED ORDER — OXYCODONE-ACETAMINOPHEN 5-325 MG PO TABS: 1.0000 | ORAL_TABLET | ORAL | Status: DC | PRN

## 2014-09-26 MED ORDER — CEFAZOLIN SODIUM-DEXTROSE 2-3 GM-% IV SOLR
INTRAVENOUS | Status: AC
  Filled 2014-09-26: qty 50

## 2014-09-26 MED ORDER — MIDAZOLAM HCL 5 MG/5ML IJ SOLN
INTRAMUSCULAR | Status: DC | PRN
  Administered 2014-09-26: 2 mg via INTRAVENOUS

## 2014-09-26 MED ORDER — SCOPOLAMINE 1 MG/3DAYS TD PT72
1.0000 | MEDICATED_PATCH | Freq: Once | TRANSDERMAL | Status: DC
  Administered 2014-09-26: 1.5 mg via TRANSDERMAL

## 2014-09-26 MED ORDER — FENTANYL CITRATE (PF) 250 MCG/5ML IJ SOLN
INTRAMUSCULAR | Status: AC
  Filled 2014-09-26: qty 5

## 2014-09-26 MED ORDER — BUPIVACAINE-EPINEPHRINE 0.5% -1:200000 IJ SOLN
INTRAMUSCULAR | Status: DC | PRN
  Administered 2014-09-26: 19 mL

## 2014-09-26 MED ORDER — PROPOFOL 10 MG/ML IV BOLUS
INTRAVENOUS | Status: DC | PRN
  Administered 2014-09-26: 120 mg via INTRAVENOUS

## 2014-09-26 MED ORDER — ONDANSETRON HCL 4 MG/2ML IJ SOLN
4.0000 mg | Freq: Once | INTRAMUSCULAR | Status: AC
  Administered 2014-09-26: 4 mg via INTRAVENOUS

## 2014-09-26 SURGICAL SUPPLY — 38 items
BAG HAMPER (MISCELLANEOUS) ×2 IMPLANT
CATH ROBINSON RED A/P 16FR (CATHETERS) IMPLANT
CLOTH BEACON ORANGE TIMEOUT ST (SAFETY) ×2 IMPLANT
COVER LIGHT HANDLE STERIS (MISCELLANEOUS) ×4 IMPLANT
DECANTER SPIKE VIAL GLASS SM (MISCELLANEOUS) ×2 IMPLANT
DRAPE PROXIMA HALF (DRAPES) ×2 IMPLANT
DRAPE STERI URO 9X17 APER PCH (DRAPES) ×2 IMPLANT
ELECT REM PT RETURN 9FT ADLT (ELECTROSURGICAL) ×2
ELECTRODE REM PT RTRN 9FT ADLT (ELECTROSURGICAL) ×1 IMPLANT
FORMALIN 10 PREFIL 120ML (MISCELLANEOUS) ×1 IMPLANT
GLOVE BIO SURGEON STRL SZ 6.5 (GLOVE) ×1 IMPLANT
GLOVE BIOGEL PI IND STRL 7.0 (GLOVE) IMPLANT
GLOVE BIOGEL PI IND STRL 7.5 (GLOVE) IMPLANT
GLOVE BIOGEL PI IND STRL 9 (GLOVE) ×1 IMPLANT
GLOVE BIOGEL PI INDICATOR 7.0 (GLOVE) ×2
GLOVE BIOGEL PI INDICATOR 7.5 (GLOVE) ×1
GLOVE BIOGEL PI INDICATOR 9 (GLOVE) ×3
GLOVE ECLIPSE 6.5 STRL STRAW (GLOVE) ×1 IMPLANT
GLOVE ECLIPSE 9.0 STRL (GLOVE) ×2 IMPLANT
GOWN SPEC L3 XXLG W/TWL (GOWN DISPOSABLE) ×3 IMPLANT
GOWN STRL REUS W/TWL LRG LVL3 (GOWN DISPOSABLE) ×4 IMPLANT
KIT ROOM TURNOVER AP CYSTO (KITS) ×2 IMPLANT
MANIFOLD NEPTUNE II (INSTRUMENTS) ×2 IMPLANT
NDL HYPO 21X1.5 SAFETY (NEEDLE) IMPLANT
NDL HYPO 25X1 1.5 SAFETY (NEEDLE) ×1 IMPLANT
NEEDLE HYPO 21X1.5 SAFETY (NEEDLE) ×2 IMPLANT
NEEDLE HYPO 25X1 1.5 SAFETY (NEEDLE) ×2 IMPLANT
NS IRRIG 1000ML POUR BTL (IV SOLUTION) ×2 IMPLANT
PACK PERI GYN (CUSTOM PROCEDURE TRAY) ×2 IMPLANT
PAD ARMBOARD 7.5X6 YLW CONV (MISCELLANEOUS) ×2 IMPLANT
SET BASIN LINEN APH (SET/KITS/TRAYS/PACK) ×2 IMPLANT
SUT CHROMIC 2 0 CT 1 (SUTURE) ×1 IMPLANT
SUT CHROMIC 3 0 SH 27 (SUTURE) ×2 IMPLANT
SUT VIC AB 0 CT2 8-18 (SUTURE) ×1 IMPLANT
SUT VIC AB 2-0 CT2 27 (SUTURE) ×2 IMPLANT
SYR BULB IRRIGATION 50ML (SYRINGE) ×1 IMPLANT
SYRINGE 10CC LL (SYRINGE) ×2 IMPLANT
TRAY FOLEY CATH SILVER 16FR (SET/KITS/TRAYS/PACK) ×1 IMPLANT

## 2014-09-26 NOTE — Discharge Instructions (Signed)
Rectocele/Enterocele, Care After A woman's birth canal (vagina) can become weak or stretched. This can be caused by childbirth, heavy lifting, lasting (chronic) constipation, aging, or pelvic surgery. When the vagina is weak and stretched, parts of the intestine can bulge into the vagina by pushing against the vaginal walls. A rectocele is when the very end of the large intestine (rectum) causes the bulge. An enterocele is when the small intestine causes the bulge. Surgery to fix this problem is usually done through the vagina. If you just had this surgery, you were probably given a drug to make you sleep (general anesthetic) or a drug that numbs you from the waist down (spinal/epidural). Here is what happened:  The small intestine or rectum was pushed back to its normal place.  The vaginal wall was made stronger. Sometimes this is done with stitches or a mesh-like material. HOME CARE INSTRUCTIONS  Some women go home the same day as their surgery. Others stay in the hospital for a few days. This depends on the size and type of repair.  Pain and Medications  Some pain is normal after this surgery. Only take pain medicine your surgeon prescribed. Follow the directions carefully.  Do not take aspirin. It can cause bleeding.  Do not drink alcohol while taking pain medication.  You may be given a medicine (antibiotic) that kills germs. Follow the directions carefully.  Take warm sitz baths 2 times a day to control discomfort and reduce any swelling. Take sitz baths with your caregiver's permission. Diet  Go back to your normal eating as directed by your caregiver.  Drink a lot of fluids. Drink at least 6 glasses of water every day. Activity  Move around and walk as much as possible. This can keep blood clots from forming in your legs.  Do not climb stairs until your caregiver says it is okay.  Do not lift objects 5 pounds (2.3 kg) or heavier. Do not bend or strain for 6 to 8 weeks.  Do  not drive until after you stop taking pain medicine and your caregiver says it is okay.  Your return to work will depend on the type of work you do. Ask your caregiver what is best for you.  Ask your caregiver when you can resume sexual activity. Most women can start having sex in about 6 weeks after their surgery.  Get plenty of rest during the day and sleep at night.  Have someone help you with your household chores and activities for 3 to 4 weeks. Other Precautions  You may have some discharge from the vagina for a few weeks after the surgery. It may have small amounts of blood in it. This is normal. If you have questions, ask your caregiver.  Do not use tampons or douche.  You should be able to take a shower a day after your surgery. Do not take a tub bath for at least a week.  Take it easy for awhile. You should feel much better in 2 to 3 weeks. It may take up to 6 weeks to feel completely normal.  Keep all follow-up appointments.  Take your temperature twice a day and write it down.  Make sure your family understands everything about your surgery and recovery. SEEK MEDICAL CARE IF:   You have any questions about your medication, or you need stronger pain medication.  Pain continues, even after taking pain medication.  You become constipated.  You have an oral temperature above 102 F (38.9 C).    You develop swelling and redness in the surgery area.  You become dizzy or lightheaded.  You feel sick to your stomach (nauseous), throw up (vomit), or have diarrhea.  You develop a rash.  You have a reaction to your medications. SEEK IMMEDIATE MEDICAL CARE IF:   Pain gets worse.  You have new bleeding from your vagina.  Discharge from the vagina becomes heavy, or it has a bad smell.  You have an oral temperature above 102 F (38.9 C), not controlled by medicine.  You develop belly (abdominal) pain.  You develop chest pain.  You develop shortness of  breath.  You pass out (faint).  You develop pain, swelling, or redness in the leg.  You have pain or burning with urination.  You have bloody urine or cannot urinate. MAKE SURE YOU:   Understand these instructions.  Will watch your condition.  Will get help right away if you are not doing well or get worse. Document Released: 07/02/2009 Document Revised: 01/26/2013 Document Reviewed: 07/02/2009 Exit[employer] Patient Information 2015 [employer]. This information is not intended to replace advice given to you by your health care provider. Make sure you discuss any questions you have with your health care provider.  

## 2014-09-26 NOTE — Transfer of Care (Signed)
Immediate Anesthesia Transfer of Care Note  Patient: Amy Clayton  Procedure(s) Performed: Procedure(s): RECTO-VAGINAL FISTULA  EXCISION (N/A)  Patient Location: PACU  Anesthesia Type:General  Level of Consciousness: awake and patient cooperative  Airway & Oxygen Therapy: Patient Spontanous Breathing and Patient connected to face mask oxygen  Post-op Assessment: Report given to RN, Post -op Vital signs reviewed and stable and Patient moving all extremities  Post vital signs: Reviewed and stable  Last Vitals:  Filed Vitals:   09/26/14 1103  BP: 133/69  Pulse: 69  Temp: 36.4 C  Resp: 13    Complications: No apparent anesthesia complications

## 2014-09-26 NOTE — Anesthesia Procedure Notes (Signed)
Procedure Name: Intubation Date/Time: 09/26/2014 9:55 AM Performed by: Charmaine Downs Pre-anesthesia Checklist: Suction available, Emergency Drugs available, Patient being monitored and Patient identified Patient Re-evaluated:Patient Re-evaluated prior to inductionOxygen Delivery Method: Circle system utilized Preoxygenation: Pre-oxygenation with 100% oxygen Intubation Type: IV induction Ventilation: Mask ventilation without difficulty and Oral airway inserted - appropriate to patient size Laryngoscope Size: Mac and 3 Grade View: Grade II Tube type: Oral Tube size: 7.0 mm Number of attempts: 1 Airway Equipment and Method: Stylet and Oral airway Placement Confirmation: ETT inserted through vocal cords under direct vision and breath sounds checked- equal and bilateral Secured at: 22 cm Tube secured with: Tape Dental Injury: Teeth and Oropharynx as per pre-operative assessment

## 2014-09-26 NOTE — Brief Op Note (Signed)
09/26/2014  11:33 AM  PATIENT:  Amy Clayton  54 y.o. female  PRE-OPERATIVE DIAGNOSIS:  Rectovaginal fistula  POST-OPERATIVE DIAGNOSIS:  Rectovaginal fistula  PROCEDURE:  Procedure(s): RECTO-VAGINAL FISTULA  EXCISION (N/A)  SURGEON:  Surgeon(s) and Role:    * Jonnie Kind, MD - Primary  PHYSICIAN ASSISTANT:   ASSISTANTS: Catherine page, RNFA   ANESTHESIA:   local and general  EBL:  Total I/O In: 900 [I.V.:900] Out: -   BLOOD ADMINISTERED:none  DRAINS: none   LOCAL MEDICATIONS USED:  MARCAINE    and Amount: 20 ml  SPECIMEN:  Source of Specimen:  Fistula tract  DISPOSITION OF SPECIMEN:  PATHOLOGY  COUNTS:  YES  TOURNIQUET:  * No tourniquets in log *  DICTATION: .Dragon Dictation  PLAN OF CARE: Discharge to home after PACU  PATIENT DISPOSITION:  PACU - hemodynamically stable.   Delay start of Pharmacological VTE agent (>24hrs) due to surgical blood loss or risk of bleeding: not applicable Details of procedure: Patient was taken operating room prepped and draped for vaginal procedure, with Ancef administered abdomen perineum prepped in timeout confirming the procedure. A lacrimal duct probe was then used to cannulate the fistula I'll digital rectal exam was being performed to confirm proper positioning. Peritoneum was opened in the midline around the fistulous tract and with the surgeon's right index finger in the rectum for orientation, I sharply dissected around the fistulous tract removing a 1 cm cylinder of fibrotic tissue and fistula tract once the specimen was excised, and the gloved finger was visible the tract was cleaned, then closed transversely with a running layer of 3-0 chromic, and then reinforced with a second layer of 3-0 chromic. Gloved hand was removed, and the perineal body again irrigated, then closed transversely. The vaginal epithelium was dissected off of the perineal body rest short distance cephalad and the perineal body closed and the method  similar to posterior repair, using interrupted 20 Vicryls to rebuild the perineal body with 3 interrupted sutures followed by a 2 layer continuous running oh Vicryls closure of the anterior perineal body and then continuing back up the vagina with continuous running 20 Vicryls closed the vaginal epithelium and tissue approximation was achieved. Repeat rectal exam this time confirmed a satisfactory rebuild the perineum with no palpable defect. The patient was then infiltrated around the perineum with Marcaine solution and then will be allowed to go home from day surgery sponge and needle counts were correct

## 2014-09-26 NOTE — Interval H&P Note (Signed)
History and Physical Interval Note:  09/26/2014 9:32 AM  Amy Clayton  has presented today for surgery, with the diagnosis of RV fistula  The various methods of treatment have been discussed with the patient and family. After consideration of risks, benefits and other options for treatment, the patient has consented to  Procedure(s): RECTO-VAGINAL FISTULA  EXCISION (N/A) as a surgical intervention .  The patient's history has been reviewed, patient examined, no change in status, stable for surgery.  I have reviewed the patient's chart and labs.  Questions were answered to the patient's satisfaction.     Jonnie Kind

## 2014-09-26 NOTE — Interval H&P Note (Signed)
History and Physical Interval Note:  09/26/2014 9:22 AM  Amy Clayton  has presented today for surgery, with the diagnosis of RV fistula  The various methods of treatment have been discussed with the patient and family. After consideration of risks, benefits and other options for treatment, the patient has consented to  Procedure(s): EXCISE RECTO-VAGINAL FISTULA (N/A) as a surgical intervention .  The patient's history has been reviewed, patient examined, no change in status, stable for surgery.  I have reviewed the patient's chart and labs.  Questions were answered to the patient's satisfaction.   The patient will likely be treated as outpatient.   Jonnie Kind

## 2014-09-26 NOTE — Anesthesia Preprocedure Evaluation (Signed)
Anesthesia Evaluation  Patient identified by MRN, date of birth, ID band Patient awake    Reviewed: Allergy & Precautions, H&P , NPO status , Patient's Chart, lab work & pertinent test results  History of Anesthesia Complications (+) PONV and history of anesthetic complications  Airway Mallampati: II  TM Distance: >3 FB     Dental  (+) Teeth Intact   Pulmonary former smoker,  breath sounds clear to auscultation        Cardiovascular hypertension, Pt. on medications negative cardio ROS  Rhythm:Regular Rate:Normal     Neuro/Psych    GI/Hepatic negative GI ROS,   Endo/Other    Renal/GU      Musculoskeletal   Abdominal   Peds  Hematology   Anesthesia Other Findings   Reproductive/Obstetrics                             Anesthesia Physical Anesthesia Plan  ASA: II  Anesthesia Plan: General   Post-op Pain Management:    Induction: Intravenous  Airway Management Planned: Oral ETT  Additional Equipment:   Intra-op Plan:   Post-operative Plan: Extubation in OR  Informed Consent: I have reviewed the patients History and Physical, chart, labs and discussed the procedure including the risks, benefits and alternatives for the proposed anesthesia with the patient or authorized representative who has indicated his/her understanding and acceptance.     Plan Discussed with:   Anesthesia Plan Comments:         Anesthesia Quick Evaluation

## 2014-09-26 NOTE — Op Note (Signed)
Please see the operative details included in the brief op note

## 2014-09-26 NOTE — Anesthesia Postprocedure Evaluation (Signed)
  Anesthesia Post-op Note  Patient: Amy Clayton  Procedure(s) Performed: Procedure(s): RECTO-VAGINAL FISTULA  EXCISION (N/A)  Patient Location: PACU  Anesthesia Type:General  Level of Consciousness: awake, alert , oriented and patient cooperative  Airway and Oxygen Therapy: Patient Spontanous Breathing  Post-op Pain: 3 /10, mild  Post-op Assessment: Post-op Vital signs reviewed, Patient's Cardiovascular Status Stable and Respiratory Function Stable  Post-op Vital Signs: Reviewed and stable  Last Vitals:  Filed Vitals:   09/26/14 1103  BP: 133/69  Pulse: 69  Temp: 36.4 C  Resp: 13    Complications: No apparent anesthesia complications

## 2014-09-27 ENCOUNTER — Encounter (HOSPITAL_COMMUNITY): Payer: Self-pay | Admitting: Obstetrics and Gynecology

## 2014-09-27 ENCOUNTER — Telehealth: Payer: Self-pay | Admitting: Obstetrics and Gynecology

## 2014-09-27 NOTE — Telephone Encounter (Signed)
Pt requesting refill miralax. Pt states can she take Ibuprofen for the post operative swelling if so how much?

## 2014-09-28 ENCOUNTER — Other Ambulatory Visit: Payer: Self-pay | Admitting: Obstetrics and Gynecology

## 2014-09-29 NOTE — Telephone Encounter (Signed)
Pt advised on ibuprofen use, and miralax use.

## 2014-10-04 ENCOUNTER — Encounter: Payer: Self-pay | Admitting: Obstetrics and Gynecology

## 2014-10-04 ENCOUNTER — Ambulatory Visit (INDEPENDENT_AMBULATORY_CARE_PROVIDER_SITE_OTHER): Payer: BLUE CROSS/BLUE SHIELD | Admitting: Obstetrics and Gynecology

## 2014-10-04 VITALS — BP 120/64 | Ht <= 58 in | Wt 123.8 lb

## 2014-10-04 DIAGNOSIS — N823 Fistula of vagina to large intestine: Secondary | ICD-10-CM

## 2014-10-04 NOTE — Progress Notes (Signed)
Patient ID: Amy Clayton, female   DOB: 1960-07-25, 54 y.o.   MRN: [id number] Pt here today for post op visit. Pt denies any problems or concerns at this time.  Physical Examination: General appearance - alert, well appearing, and in no distress and normal appearing weight Pelvic - normal external genitalia, vulva, vagina, cervix, uterus and adnexa, VULVA: normal appearing vulva with no masses, tenderness or lesions, healing beautifully A excellent postop recovery so far P continue stool softener

## 2014-11-01 ENCOUNTER — Encounter: Payer: Self-pay | Admitting: Obstetrics and Gynecology

## 2014-11-01 ENCOUNTER — Ambulatory Visit (INDEPENDENT_AMBULATORY_CARE_PROVIDER_SITE_OTHER): Payer: BLUE CROSS/BLUE SHIELD | Admitting: Obstetrics and Gynecology

## 2014-11-01 VITALS — BP 118/76 | Ht 59.0 in | Wt 124.0 lb

## 2014-11-01 DIAGNOSIS — N823 Fistula of vagina to large intestine: Secondary | ICD-10-CM | POA: Insufficient documentation

## 2014-11-01 DIAGNOSIS — Z9889 Other specified postprocedural states: Secondary | ICD-10-CM

## 2014-11-01 MED ORDER — CIPROFLOXACIN HCL 500 MG PO TABS: 500.0000 mg | ORAL_TABLET | Freq: Two times a day (BID) | ORAL | Status: DC

## 2014-11-01 NOTE — Progress Notes (Signed)
Patient ID: Amy Clayton, female   DOB: 09-04-1960, 54 y.o.   MRN: [id number]   Subjective:  Amy Clayton is a 54 y.o. female now 5 weeks status post [place].  Pt states that she is worried that the surgery didn't take, as she is still having pressure and a marble like sensation. Pt states that she thinks she may have an umbilical hernia.   Review of Systems Negative except as noted above   Diet: normal   Bowel movements : normal.  The patient is not having any pain.  Objective:  BP 118/76 mmHg  Ht 4\' 11"  (1.499 m)  Wt 124 lb (56.246 kg)  BMI 25.03 kg/m2 General:Well developed, well nourished.  No acute distress. Abdomen: Bowel sounds normal, soft, non-tender. Pelvic Exam:    External Genitalia:  Normal.    Vagina: Normal    Cervix: Normal    Uterus: Normal    Adnexa/Bimanual: Normal  Incision(s):   Healing well, no drainage, no erythema, no hernia, no swelling, no dehiscence, but there's some firmness in the repair that may be developing fibrosis or it may be early recurrent inflammation     Assessment:  Post-Op 5 weeks s/p FISTULA REPAIR VESICO-VAGINAL   pt concerned that rvf may be recurring, due to firmness noted in perineum Doing well postoperatively. Some concern for recurrent inflammaiton in perineum versus surgical scarring. W   Plan:  1.Wound care discussed   Will treat empirically with cipro. Will discuss with Dr Laural Golden re: whether we should consider Remicade, and if so , when. 2. . current medications. n/a 3. Activity restrictions: none 4. return to work: not applicable. 5. Follow up in 2 weeks. To check for perineal tenderness   This chart was SCRIBED for Mallory Shirk, MD by Stephania Fragmin, ED Scribe. This patient was seen in room 2 and the patient's care was started at 10:29 AM.  I personally performed the services described in this documentation, which was SCRIBED in my presence. The recorded information has been reviewed and  considered accurate. It has been edited as necessary during review. Jonnie Kind, MD

## 2014-11-01 NOTE — Progress Notes (Signed)
Patient ID: Amy Clayton, female   DOB: [birth year], 54 y.o.   MRN: [id number] Pt here today for post op visit. Pt states that she is worried that the surgery didn't take! Pt states that she is still having the pressure and marble like thing.  Pt states that she thinks she may have an umbilical hernia.

## 2014-11-22 ENCOUNTER — Encounter: Payer: Self-pay | Admitting: Obstetrics and Gynecology

## 2014-11-22 ENCOUNTER — Ambulatory Visit (INDEPENDENT_AMBULATORY_CARE_PROVIDER_SITE_OTHER): Payer: BLUE CROSS/BLUE SHIELD | Admitting: Obstetrics and Gynecology

## 2014-11-22 VITALS — BP 100/60 | Ht <= 58 in | Wt 124.0 lb

## 2014-11-22 DIAGNOSIS — Z09 Encounter for follow-up examination after completed treatment for conditions other than malignant neoplasm: Secondary | ICD-10-CM

## 2014-11-22 DIAGNOSIS — Z9889 Other specified postprocedural states: Secondary | ICD-10-CM

## 2014-11-22 NOTE — Progress Notes (Signed)
Patient ID: Amy Clayton, female   DOB: 1960/05/06, 54 y.o.   MRN: [id number]  Subjective:     Amy Clayton is a 54 y.o. female who presents to the clinic 8 weeks status post [place] for recurrent perianal fistula. Pt reports continued pressure and a marble-like sensation. Pt was last seen in the office 2 weeks ago for the same. She was started on Cipro that started 2 weeks ago with no improvement or change in her symptoms.  Diet:       regular without difficulty. Bowel function is: normal. Pain:     Pain is controlled without any medications.  The following portions of the patient's history were reviewed and updated as appropriate: allergies, current medications, past family history, past medical history, past social history, past surgical history and problem list.  Review of Systems Pertinent items are noted in HPI.    Objective:    BP 100/60 mmHg  Ht 4\' 10"  (1.473 m)  Wt 124 lb (56.246 kg)  BMI 25.92 kg/m2 General:  alert and cooperative  Abdomen: soft, bowel sounds active, non-tender  Incision:   healing well, no drainage, no erythema, no hernia, no seroma, no swelling, no dehiscence, incision well approximated       Pelvic: Firmness in recto-vaginal septum; suspect scar tissue but cannot r/o recurrent inflammation    Assessment:  Incision healing well.   Postoperative course complicated by continued perineal pressure . Suspect scar tissue. Operative findings again reviewed. Pathology report discussed.    Plan:    1. Continue any current medications. 2. Activity restrictions: none 3. Anticipated return to work: not applicable. 4. Follow up: as needed    This chart was scribed for Jonnie Kind, MD by Tula Nakayama, Medical Scribe. This patient was seen in room 2 and the patient's care was started at 10:36 AM.   I personally performed the services described in this documentation, which was SCRIBED in my presence. The recorded information  has been reviewed and considered accurate. It has been edited as necessary during review. Jonnie Kind, MD

## 2014-11-22 NOTE — Progress Notes (Signed)
Patient ID: Amy Clayton, female   DOB: [birth year], 53 y.o.   MRN: [id number] Pt here today for follow up. Pt states that things are still the same. Pt states that things have not gotten any better.

## 2016-11-03 DIAGNOSIS — Z1283 Encounter for screening for malignant neoplasm of skin: Secondary | ICD-10-CM | POA: Diagnosis not present

## 2016-11-03 DIAGNOSIS — Z85820 Personal history of malignant melanoma of skin: Secondary | ICD-10-CM | POA: Diagnosis not present

## 2016-11-03 DIAGNOSIS — Z08 Encounter for follow-up examination after completed treatment for malignant neoplasm: Secondary | ICD-10-CM | POA: Diagnosis not present

## 2016-12-05 DIAGNOSIS — I10 Essential (primary) hypertension: Secondary | ICD-10-CM | POA: Diagnosis not present

## 2016-12-09 DIAGNOSIS — I10 Essential (primary) hypertension: Secondary | ICD-10-CM | POA: Diagnosis not present

## 2016-12-09 DIAGNOSIS — E782 Mixed hyperlipidemia: Secondary | ICD-10-CM | POA: Diagnosis not present

## 2016-12-09 DIAGNOSIS — L989 Disorder of the skin and subcutaneous tissue, unspecified: Secondary | ICD-10-CM | POA: Diagnosis not present

## 2016-12-09 DIAGNOSIS — G589 Mononeuropathy, unspecified: Secondary | ICD-10-CM | POA: Diagnosis not present

## 2017-02-16 DIAGNOSIS — Z1283 Encounter for screening for malignant neoplasm of skin: Secondary | ICD-10-CM | POA: Diagnosis not present

## 2017-02-16 DIAGNOSIS — Z08 Encounter for follow-up examination after completed treatment for malignant neoplasm: Secondary | ICD-10-CM | POA: Diagnosis not present

## 2017-02-16 DIAGNOSIS — Z85820 Personal history of malignant melanoma of skin: Secondary | ICD-10-CM | POA: Diagnosis not present

## 2017-06-09 DIAGNOSIS — I10 Essential (primary) hypertension: Secondary | ICD-10-CM | POA: Diagnosis not present

## 2017-06-09 DIAGNOSIS — E782 Mixed hyperlipidemia: Secondary | ICD-10-CM | POA: Diagnosis not present

## 2017-06-09 DIAGNOSIS — G589 Mononeuropathy, unspecified: Secondary | ICD-10-CM | POA: Diagnosis not present

## 2017-06-11 DIAGNOSIS — Z0001 Encounter for general adult medical examination with abnormal findings: Secondary | ICD-10-CM | POA: Diagnosis not present

## 2017-06-11 DIAGNOSIS — I10 Essential (primary) hypertension: Secondary | ICD-10-CM | POA: Diagnosis not present

## 2017-06-11 DIAGNOSIS — E782 Mixed hyperlipidemia: Secondary | ICD-10-CM | POA: Diagnosis not present

## 2017-08-24 DIAGNOSIS — Z08 Encounter for follow-up examination after completed treatment for malignant neoplasm: Secondary | ICD-10-CM | POA: Diagnosis not present

## 2017-08-24 DIAGNOSIS — Z85820 Personal history of malignant melanoma of skin: Secondary | ICD-10-CM | POA: Diagnosis not present

## 2017-08-24 DIAGNOSIS — Z1283 Encounter for screening for malignant neoplasm of skin: Secondary | ICD-10-CM | POA: Diagnosis not present

## 2017-11-01 DIAGNOSIS — L03011 Cellulitis of right finger: Secondary | ICD-10-CM | POA: Diagnosis not present

## 2017-11-04 DIAGNOSIS — Z0001 Encounter for general adult medical examination with abnormal findings: Secondary | ICD-10-CM | POA: Diagnosis not present

## 2017-11-04 DIAGNOSIS — L03019 Cellulitis of unspecified finger: Secondary | ICD-10-CM | POA: Diagnosis not present

## 2017-11-30 DIAGNOSIS — Z1283 Encounter for screening for malignant neoplasm of skin: Secondary | ICD-10-CM | POA: Diagnosis not present

## 2017-11-30 DIAGNOSIS — Z08 Encounter for follow-up examination after completed treatment for malignant neoplasm: Secondary | ICD-10-CM | POA: Diagnosis not present

## 2017-11-30 DIAGNOSIS — Z85820 Personal history of malignant melanoma of skin: Secondary | ICD-10-CM | POA: Diagnosis not present

## 2017-12-02 DIAGNOSIS — R945 Abnormal results of liver function studies: Secondary | ICD-10-CM | POA: Diagnosis not present

## 2017-12-02 DIAGNOSIS — Z0001 Encounter for general adult medical examination with abnormal findings: Secondary | ICD-10-CM | POA: Diagnosis not present

## 2017-12-04 DIAGNOSIS — E782 Mixed hyperlipidemia: Secondary | ICD-10-CM | POA: Diagnosis not present

## 2017-12-04 DIAGNOSIS — I10 Essential (primary) hypertension: Secondary | ICD-10-CM | POA: Diagnosis not present

## 2018-03-01 DIAGNOSIS — Z08 Encounter for follow-up examination after completed treatment for malignant neoplasm: Secondary | ICD-10-CM | POA: Diagnosis not present

## 2018-03-01 DIAGNOSIS — Z85820 Personal history of malignant melanoma of skin: Secondary | ICD-10-CM | POA: Diagnosis not present

## 2018-03-01 DIAGNOSIS — Z1283 Encounter for screening for malignant neoplasm of skin: Secondary | ICD-10-CM | POA: Diagnosis not present

## 2018-03-09 DIAGNOSIS — Z23 Encounter for immunization: Secondary | ICD-10-CM | POA: Diagnosis not present

## 2018-05-31 DIAGNOSIS — R945 Abnormal results of liver function studies: Secondary | ICD-10-CM | POA: Diagnosis not present

## 2018-05-31 DIAGNOSIS — Z08 Encounter for follow-up examination after completed treatment for malignant neoplasm: Secondary | ICD-10-CM | POA: Diagnosis not present

## 2018-05-31 DIAGNOSIS — Z1283 Encounter for screening for malignant neoplasm of skin: Secondary | ICD-10-CM | POA: Diagnosis not present

## 2018-05-31 DIAGNOSIS — E782 Mixed hyperlipidemia: Secondary | ICD-10-CM | POA: Diagnosis not present

## 2018-05-31 DIAGNOSIS — I10 Essential (primary) hypertension: Secondary | ICD-10-CM | POA: Diagnosis not present

## 2018-05-31 DIAGNOSIS — Z85820 Personal history of malignant melanoma of skin: Secondary | ICD-10-CM | POA: Diagnosis not present

## 2018-06-07 DIAGNOSIS — I10 Essential (primary) hypertension: Secondary | ICD-10-CM | POA: Diagnosis not present

## 2018-06-07 DIAGNOSIS — Z0000 Encounter for general adult medical examination without abnormal findings: Secondary | ICD-10-CM | POA: Diagnosis not present

## 2018-06-07 DIAGNOSIS — E782 Mixed hyperlipidemia: Secondary | ICD-10-CM | POA: Diagnosis not present

## 2019-05-09 ENCOUNTER — Other Ambulatory Visit: Payer: Self-pay

## 2019-05-09 ENCOUNTER — Ambulatory Visit: Payer: BLUE CROSS/BLUE SHIELD | Attending: Internal Medicine

## 2019-05-09 DIAGNOSIS — Z20822 Contact with and (suspected) exposure to covid-19: Secondary | ICD-10-CM

## 2019-05-10 LAB — NOVEL CORONAVIRUS, NAA: SARS-CoV-2, NAA: NOT DETECTED

## 2019-05-17 ENCOUNTER — Encounter (INDEPENDENT_AMBULATORY_CARE_PROVIDER_SITE_OTHER): Payer: Self-pay | Admitting: *Deleted

## 2019-07-08 ENCOUNTER — Ambulatory Visit: Payer: BLUE CROSS/BLUE SHIELD | Attending: Internal Medicine

## 2019-07-08 ENCOUNTER — Ambulatory Visit: Payer: BLUE CROSS/BLUE SHIELD

## 2019-07-08 DIAGNOSIS — Z23 Encounter for immunization: Secondary | ICD-10-CM

## 2019-07-08 NOTE — Progress Notes (Signed)
   Covid-19 Vaccination Clinic  Name:  Amy Clayton    MRN: [id number] DOB: April 11, 1961  07/08/2019  Amy Clayton was observed post Covid-19 immunization for 15 minutes without incident. She was provided with Vaccine Information Sheet and instruction to access the V-Safe system.   Amy Clayton was instructed to call 911 with any severe reactions post vaccine: Marland Kitchen Difficulty breathing  . Swelling of face and throat  . A fast heartbeat  . A bad rash all over body  . Dizziness and weakness   Immunizations Administered    Name Date Dose VIS Date Route   Moderna COVID-19 Vaccine 07/08/2019 11:33 AM 0.5 mL 03/22/2019 Intramuscular   Manufacturer: Moderna   Lot: [id number]   [id number]

## 2019-08-09 ENCOUNTER — Ambulatory Visit: Payer: BLUE CROSS/BLUE SHIELD

## 2019-08-09 ENCOUNTER — Ambulatory Visit: Payer: BLUE CROSS/BLUE SHIELD | Attending: Internal Medicine

## 2019-08-09 DIAGNOSIS — Z23 Encounter for immunization: Secondary | ICD-10-CM

## 2019-08-09 NOTE — Progress Notes (Signed)
   Covid-19 Vaccination Clinic  Name:  Amy Clayton    MRN: [id number] DOB: Jun 25, 1960  08/09/2019  [name] was observed post Covid-19 immunization for 15 minutes without incident. She was provided with Vaccine Information Sheet and instruction to access the V-Safe system.   [name] was instructed to call 911 with any severe reactions post vaccine: Marland Kitchen Difficulty breathing  . Swelling of face and throat  . A fast heartbeat  . A bad rash all over body  . Dizziness and weakness   Immunizations Administered    Name Date Dose VIS Date Route   Moderna COVID-19 Vaccine 08/09/2019 11:25 AM 0.5 mL 03/2019 Intramuscular   Manufacturer: Moderna   Lot: [id number]   [id number]

## 2020-01-26 ENCOUNTER — Other Ambulatory Visit (HOSPITAL_COMMUNITY): Payer: Self-pay | Admitting: Internal Medicine

## 2020-01-26 DIAGNOSIS — Z1231 Encounter for screening mammogram for malignant neoplasm of breast: Secondary | ICD-10-CM

## 2020-02-06 ENCOUNTER — Other Ambulatory Visit: Payer: Self-pay

## 2020-02-06 ENCOUNTER — Ambulatory Visit (HOSPITAL_COMMUNITY)
Admission: RE | Admit: 2020-02-06 | Discharge: 2020-02-06 | Disposition: A | Discharge status: Home / Self Care | Payer: Managed Care, Other (non HMO) | Source: Ambulatory Visit | Attending: Internal Medicine | Admitting: Internal Medicine

## 2020-02-06 DIAGNOSIS — Z1231 Encounter for screening mammogram for malignant neoplasm of breast: Secondary | ICD-10-CM | POA: Diagnosis not present

## 2020-04-03 ENCOUNTER — Other Ambulatory Visit (HOSPITAL_COMMUNITY): Payer: Self-pay | Admitting: Internal Medicine

## 2020-04-03 ENCOUNTER — Ambulatory Visit (HOSPITAL_COMMUNITY)
Admission: RE | Admit: 2020-04-03 | Discharge: 2020-04-03 | Disposition: A | Discharge status: Home / Self Care | Payer: Managed Care, Other (non HMO) | Source: Ambulatory Visit | Attending: Internal Medicine | Admitting: Internal Medicine

## 2020-04-03 ENCOUNTER — Other Ambulatory Visit: Payer: Self-pay

## 2020-04-03 DIAGNOSIS — R051 Acute cough: Secondary | ICD-10-CM | POA: Insufficient documentation

## 2020-08-09 ENCOUNTER — Encounter (INDEPENDENT_AMBULATORY_CARE_PROVIDER_SITE_OTHER): Payer: Self-pay | Admitting: *Deleted

## 2020-11-29 IMAGING — MG DIGITAL SCREENING BILAT W/ TOMO W/ CAD
6 of 10 series · 6 of 30 positions shown · non-contrast
Comparison: Previous exam(s).

CLINICAL DATA: Screening.

EXAM:
DIGITAL SCREENING BILATERAL MAMMOGRAM WITH TOMO AND CAD

[L MLO synth-2D (1 of 2)]
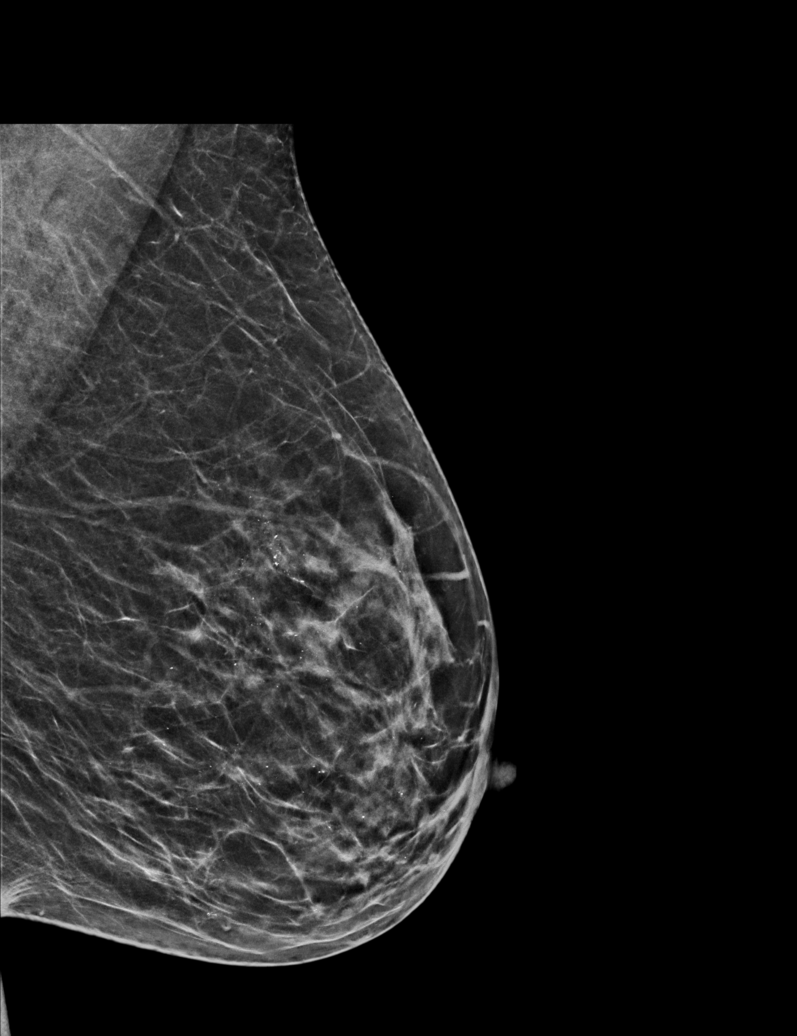

[L MLO synth-2D (2 of 2)]
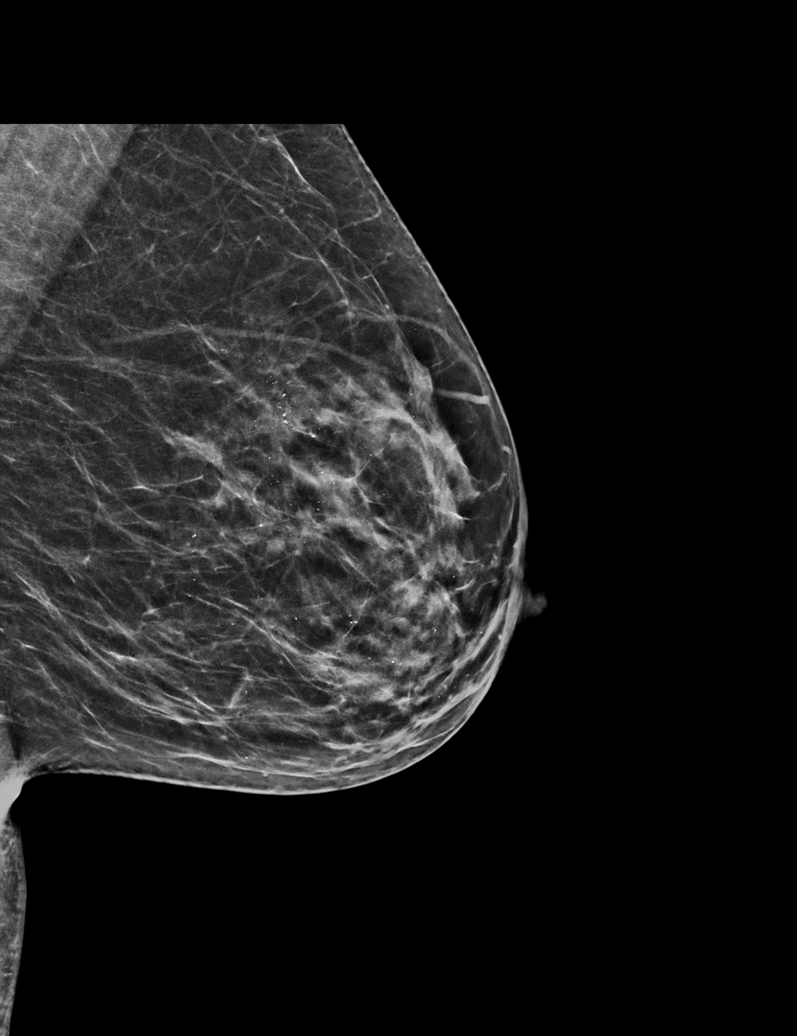

[R CC synth-2D]
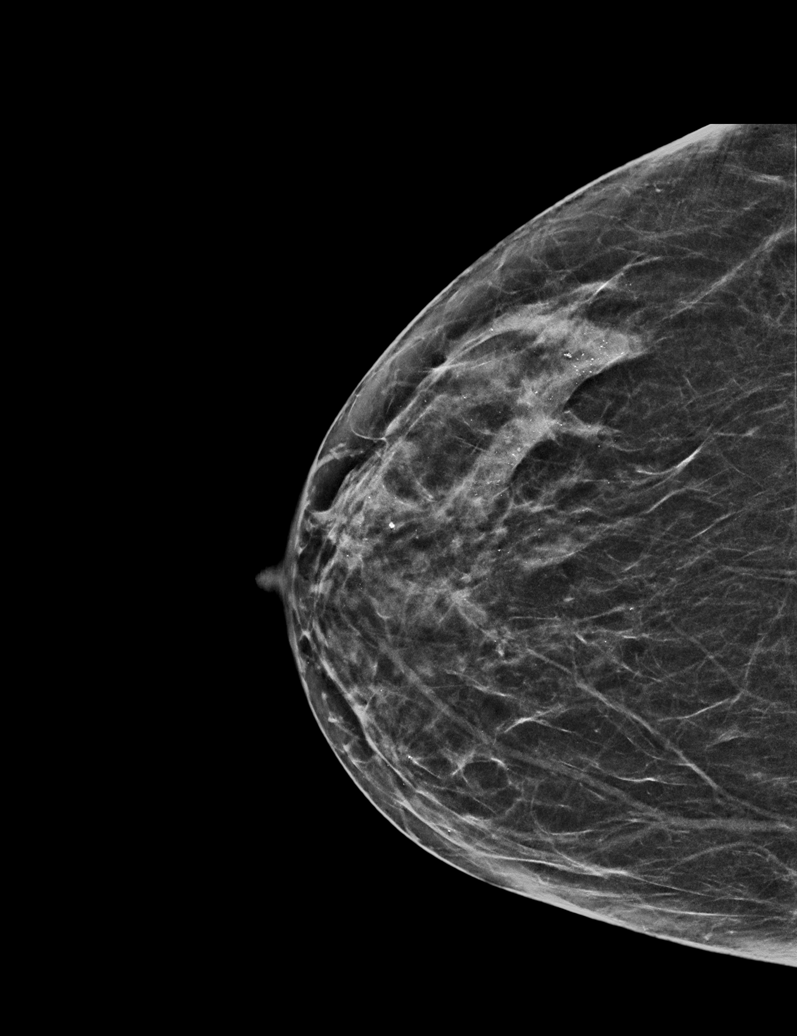

[L CC synth-2D]
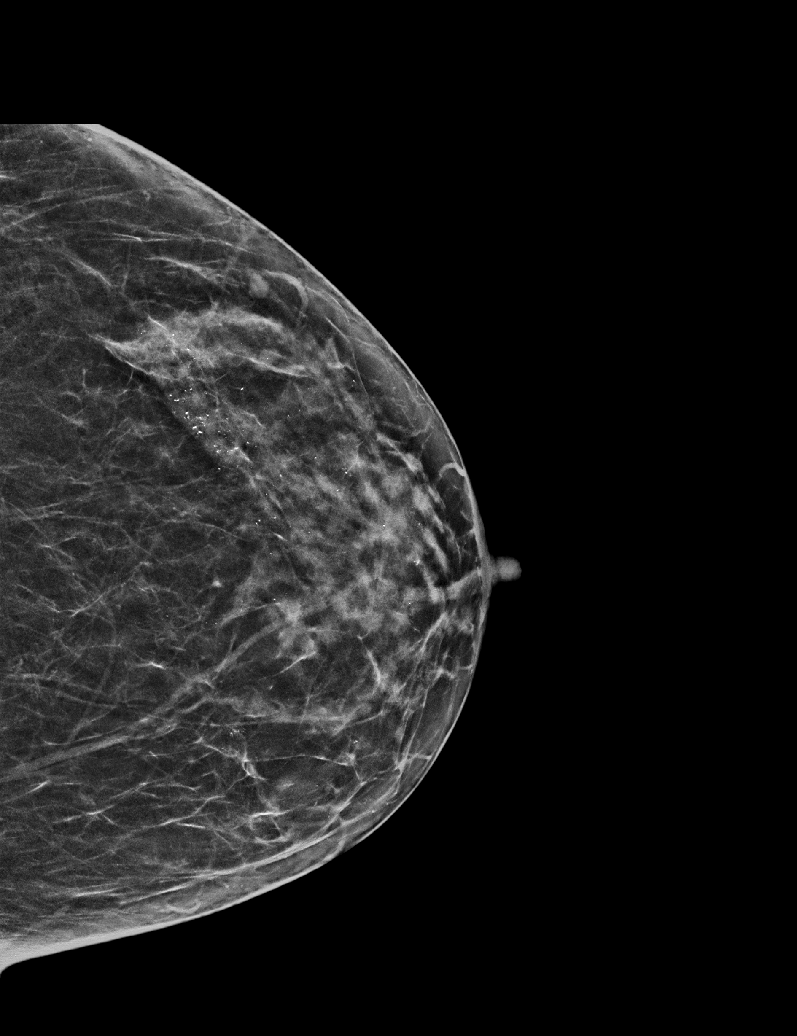

[R MLO synth-2D]
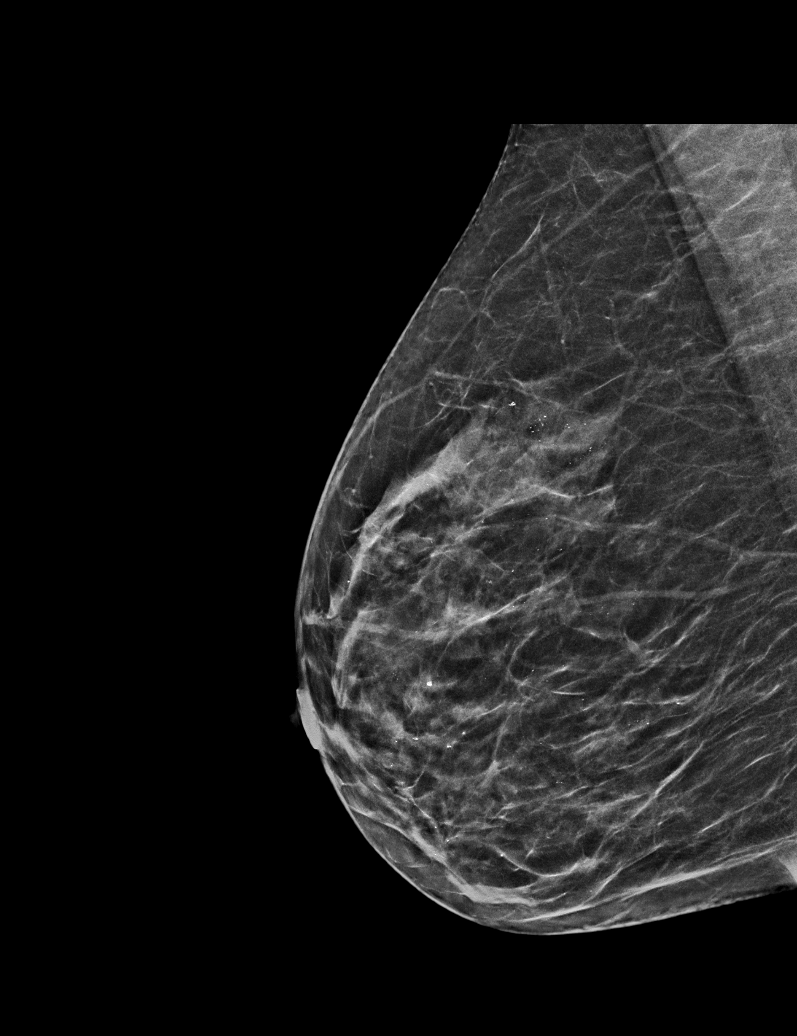

[R MLO tomo · tomo slice 27/53.0]
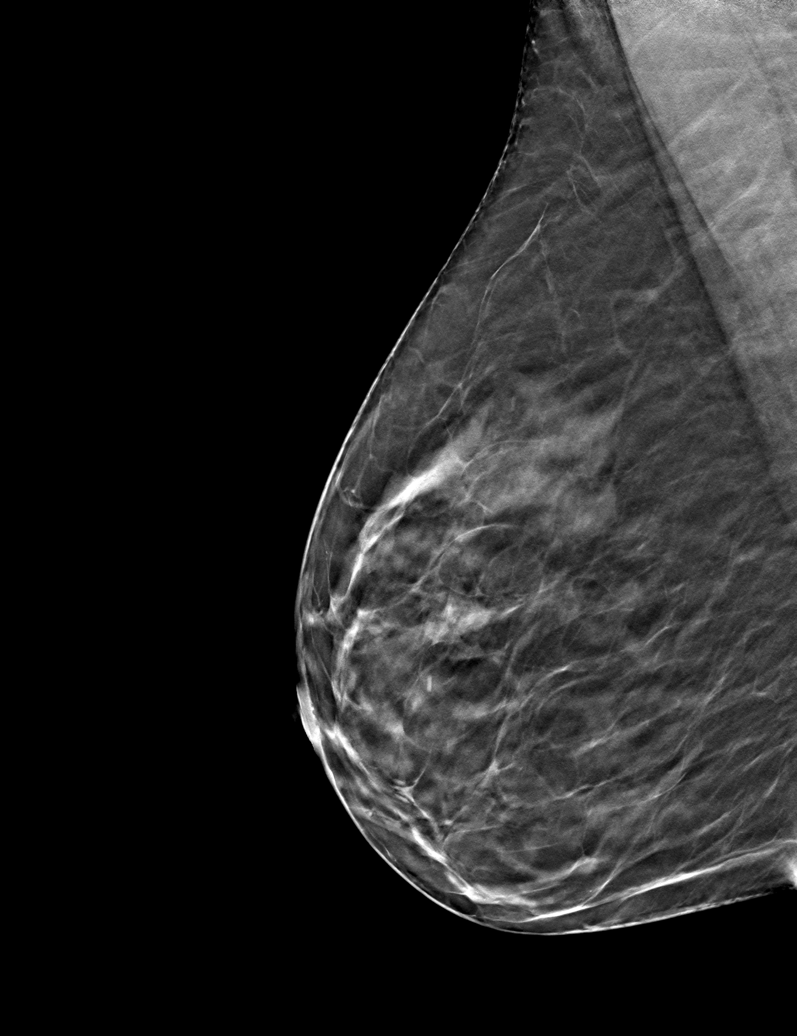

[6 of 30 positions shown; findings below may reference images not displayed]

ACR Breast Density Category c: The breast tissue is heterogeneously
dense, which may obscure small masses.
FINDINGS: There are no findings suspicious for malignancy. Images were
processed with CAD.
IMPRESSION: No mammographic evidence of malignancy. A result letter of this
screening mammogram will be mailed directly to the patient.

RECOMMENDATION:
Screening mammogram in one year. (Code:FT-U-LHB)

BI-RADS CATEGORY  1: Negative.

## 2021-01-25 IMAGING — DX DG CHEST 2V
2 series · 2 of 2 positions shown · non-contrast
Comparison: None

CLINICAL DATA: Dry cough

EXAM:
CHEST - 2 VIEW

[chest pa]
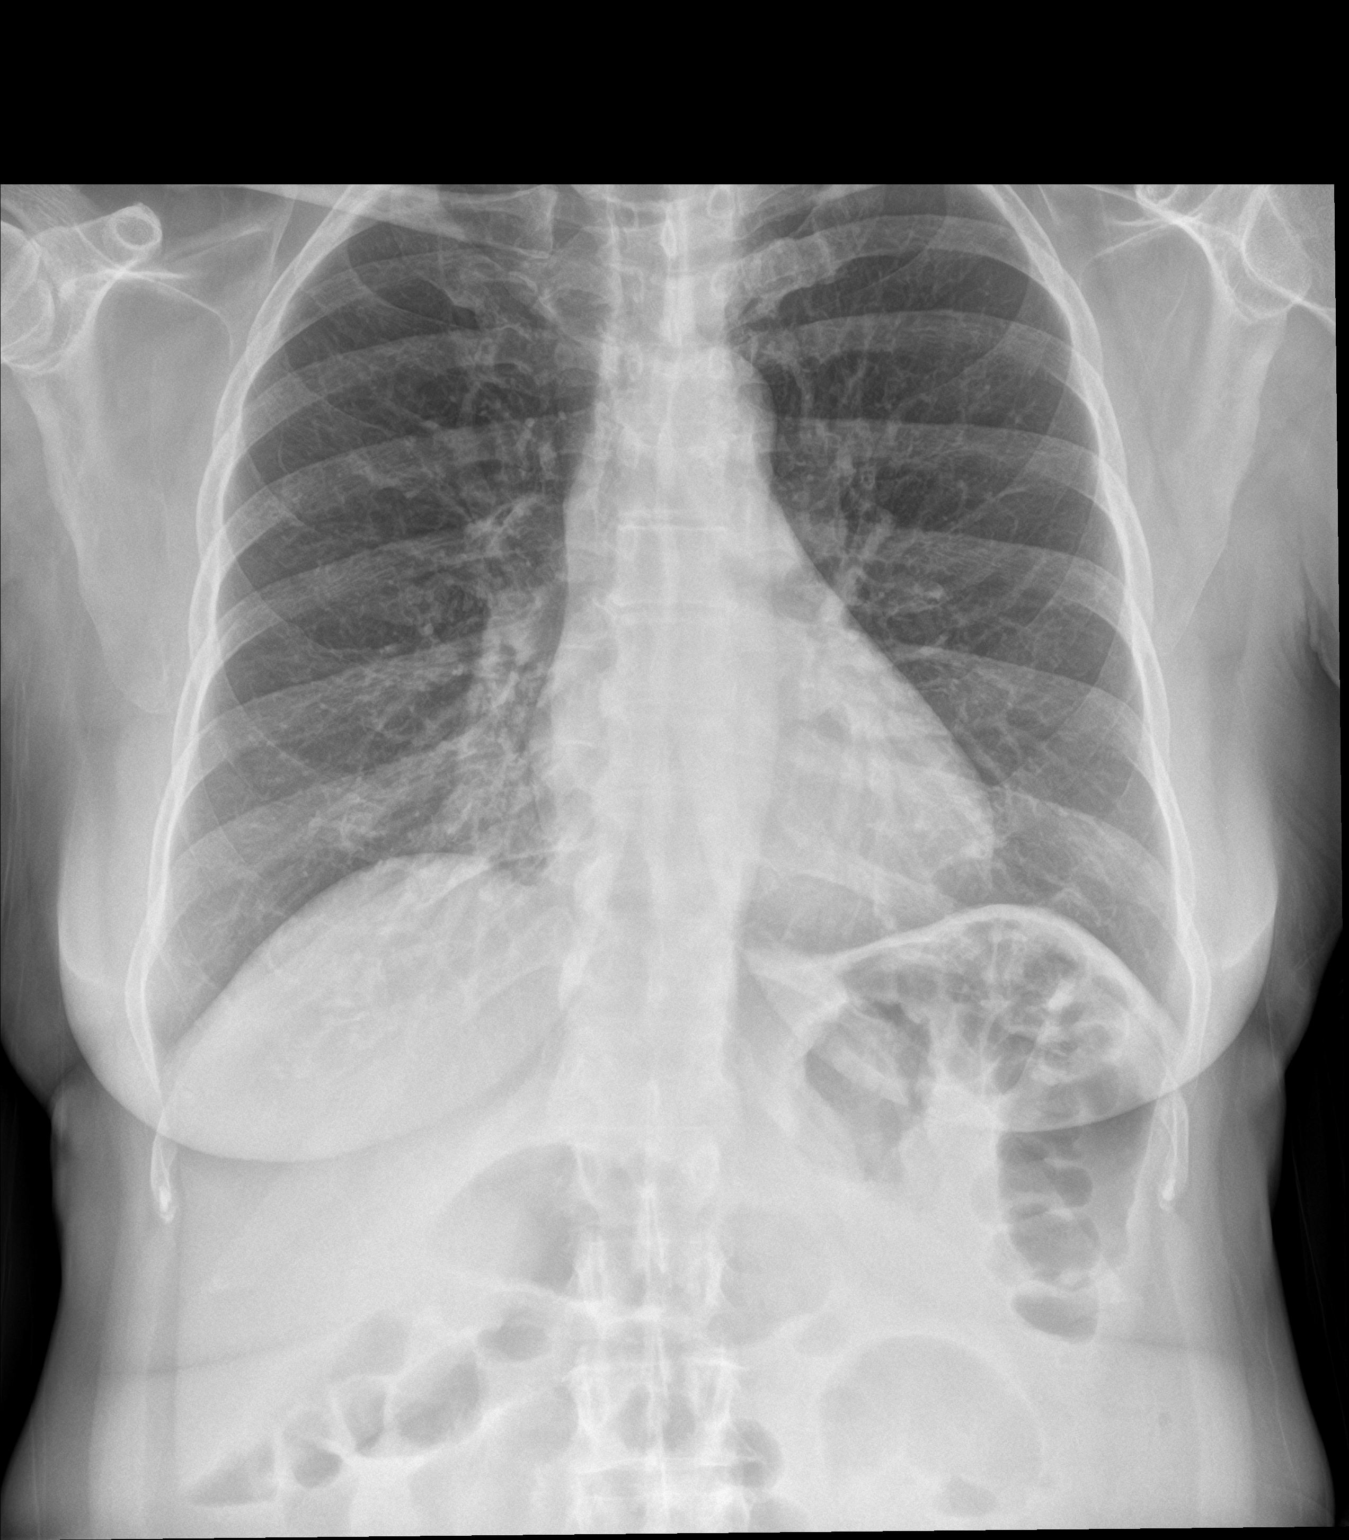

[chest lat]
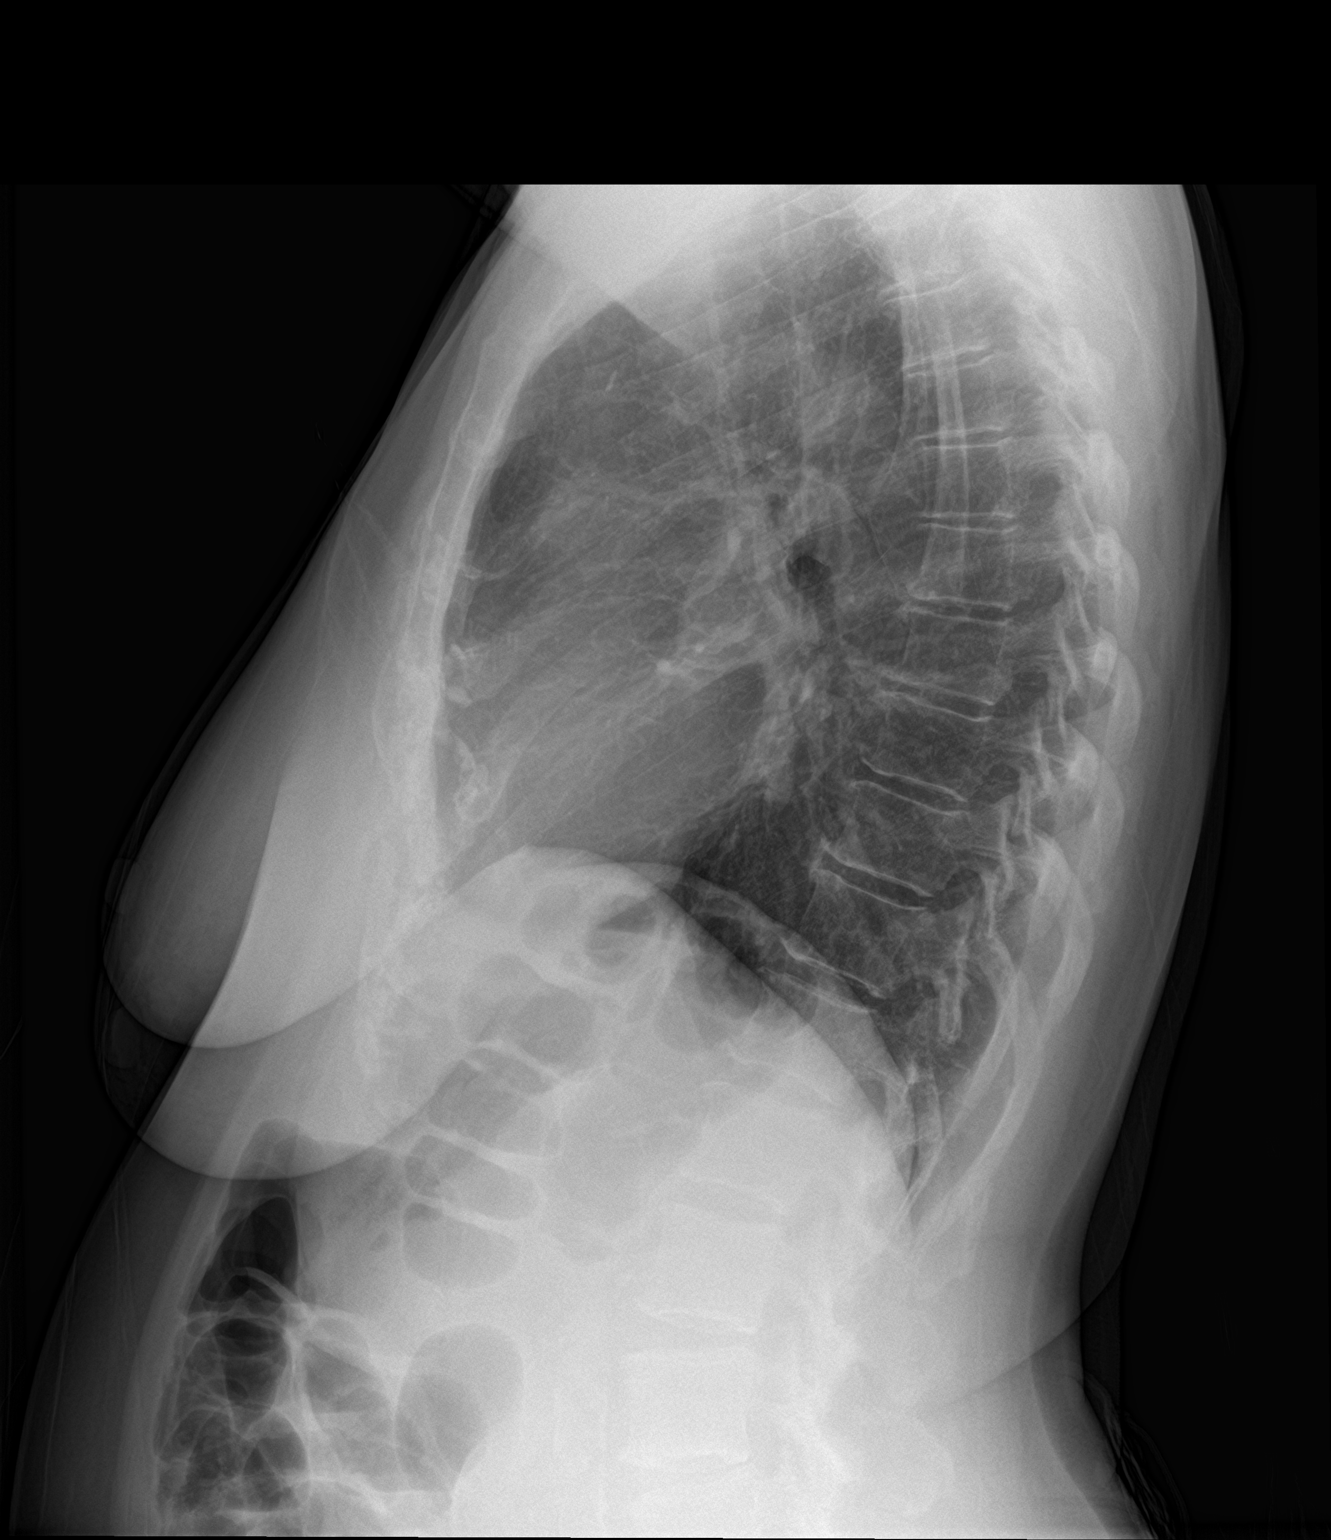

[2 of 2 positions shown; findings below may reference images not displayed]

FINDINGS: Mild bronchitic changes. No consolidation or effusion. Normal
cardiomediastinal silhouette. No pneumothorax
IMPRESSION: Mild bronchitic changes. No focal pulmonary infiltrate.

## 2021-09-17 ENCOUNTER — Other Ambulatory Visit (HOSPITAL_COMMUNITY): Payer: Self-pay | Admitting: Internal Medicine

## 2021-09-17 DIAGNOSIS — R011 Cardiac murmur, unspecified: Secondary | ICD-10-CM

## 2021-09-23 ENCOUNTER — Ambulatory Visit (HOSPITAL_COMMUNITY)
Admission: RE | Admit: 2021-09-23 | Discharge: 2021-09-23 | Disposition: A | Discharge status: Home / Self Care | Payer: Managed Care, Other (non HMO) | Source: Ambulatory Visit | Attending: Internal Medicine | Admitting: Internal Medicine

## 2021-09-23 DIAGNOSIS — R011 Cardiac murmur, unspecified: Secondary | ICD-10-CM | POA: Insufficient documentation

## 2021-09-23 LAB — ECHOCARDIOGRAM COMPLETE
AR max vel: 2.28 cm2
AV Area VTI: 2.58 cm2
AV Area mean vel: 2.62 cm2
AV Mean grad: 4 mmHg
AV Peak grad: 10.4 mmHg
Ao pk vel: 1.61 m/s
Area-P 1/2: 3.81 cm2
Calc EF: 64 %
MV VTI: 2.21 cm2
P 1/2 time: 513 msec
S' Lateral: 2.65 cm
Single Plane A2C EF: 63.8 %
Single Plane A4C EF: 64.1 %

## 2021-09-23 NOTE — Progress Notes (Signed)
*  PRELIMINARY RESULTS* Echocardiogram 2D Echocardiogram has been performed.  Amy Clayton 09/23/2021, 10:27 AM

## 2022-05-23 ENCOUNTER — Other Ambulatory Visit (HOSPITAL_COMMUNITY): Payer: Self-pay | Admitting: Internal Medicine

## 2022-05-23 DIAGNOSIS — Z1231 Encounter for screening mammogram for malignant neoplasm of breast: Secondary | ICD-10-CM

## 2022-06-02 ENCOUNTER — Ambulatory Visit (HOSPITAL_COMMUNITY)
Admission: RE | Admit: 2022-06-02 | Discharge: 2022-06-02 | Disposition: A | Discharge status: Home / Self Care | Payer: Managed Care, Other (non HMO) | Source: Ambulatory Visit | Attending: Internal Medicine | Admitting: Internal Medicine

## 2022-06-02 DIAGNOSIS — Z1231 Encounter for screening mammogram for malignant neoplasm of breast: Secondary | ICD-10-CM

## 2023-04-23 DIAGNOSIS — E782 Mixed hyperlipidemia: Secondary | ICD-10-CM | POA: Diagnosis not present

## 2023-04-23 DIAGNOSIS — R7301 Impaired fasting glucose: Secondary | ICD-10-CM | POA: Diagnosis not present

## 2023-04-29 ENCOUNTER — Other Ambulatory Visit (HOSPITAL_COMMUNITY): Payer: Self-pay | Admitting: Internal Medicine

## 2023-04-29 DIAGNOSIS — Z1231 Encounter for screening mammogram for malignant neoplasm of breast: Secondary | ICD-10-CM

## 2023-04-29 DIAGNOSIS — Z13820 Encounter for screening for osteoporosis: Secondary | ICD-10-CM

## 2023-06-18 ENCOUNTER — Other Ambulatory Visit (HOSPITAL_COMMUNITY): Payer: BLUE CROSS/BLUE SHIELD

## 2023-06-18 ENCOUNTER — Ambulatory Visit (HOSPITAL_COMMUNITY): Payer: BLUE CROSS/BLUE SHIELD

## 2023-06-27 ENCOUNTER — Ambulatory Visit
Admission: RE | Admit: 2023-06-27 | Discharge: 2023-06-27 | Disposition: A | Discharge status: Home / Self Care | Source: Ambulatory Visit | Attending: Family Medicine | Admitting: Family Medicine

## 2023-06-27 VITALS — BP 160/70 | HR 81 | Temp 98.3°F | Resp 20

## 2023-06-27 DIAGNOSIS — J208 Acute bronchitis due to other specified organisms: Secondary | ICD-10-CM

## 2023-06-27 MED ORDER — ALBUTEROL SULFATE HFA 108 (90 BASE) MCG/ACT IN AERS: 2.0000 | INHALATION_SPRAY | RESPIRATORY_TRACT | 0 refills | Status: AC | PRN

## 2023-06-27 MED ORDER — PREDNISONE 20 MG PO TABS: 40.0000 mg | ORAL_TABLET | Freq: Every day | ORAL | 0 refills | Status: AC

## 2023-06-27 MED ORDER — PROMETHAZINE-DM 6.25-15 MG/5ML PO SYRP: 5.0000 mL | ORAL_SOLUTION | Freq: Four times a day (QID) | ORAL | 0 refills | Status: DC | PRN

## 2023-06-27 NOTE — ED Provider Notes (Signed)
 [facility]    CSN: [id number] Arrival date & time: 06/27/23  1146      History   Chief Complaint Chief Complaint  Patient presents with   Cough    Entered by patient    HPI Amy Clayton is a 63 y.o. female.   Presenting today with over a week of hacking occasionally productive cough, congestion, scratchy throat.  Denies fever, chills, chest pain, shortness of breath, abdominal pain, vomiting, diarrhea.  No known history of chronic pulmonary disease but is a former smoker.  States she tends to get bronchitis when she gets sick.  Trying over-the-counter cold and congestion medications with minimal relief.    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/26/2014   Vulvar cysts 02/28/2014   MIXED HYPERLIPIDEMIA 06/26/2010   MURMUR 06/26/2010   CHEST PAIN UNSPECIFIED 06/26/2010   History of cardiovascular disorder 06/26/2010    Past Surgical History:  Procedure Laterality Date   COLONOSCOPY N/A 06/01/2014   Procedure: COLONOSCOPY;  Surgeon: Malissa Hippo, MD;  Location: [facility];  Service: Endoscopy;  Laterality: N/A;  145 - moved to 12:45 - Ann to notify pt   EXAMINATION UNDER ANESTHESIA N/A 07/17/2014   Procedure: EXAMINATION UNDER ANESTHESIA , RECTOVAGINAL;  Surgeon: Franky Macho Md, MD;  Location: [facility];  Service: General;  Laterality: N/A;   INCISION AND DRAINAGE ABSCESS N/A 12/12/2013   Procedure: INCISION AND DRAINAGE PERIRECTAL  ABSCESS;  Surgeon: Dalia Heading, MD;  Location: [facility];  Service: General;  Laterality: N/A;   LAPAROSCOPIC SALPINGOOPHERECTOMY Right 1985   MASS EXCISION N/A 03/14/2014   Procedure:  EXCISION OF VULVAR FIBROSIS; EXCISION LEFT GLUTEAL FISTULA  IN ANO;  Surgeon: Tilda Burrow, MD;  Location: [facility];  Service: Gynecology;  Laterality: N/A;   TONSILLECTOMY     VESICO-VAGINAL FISTULA REPAIR N/A 09/26/2014   Procedure: RECTO-VAGINAL FISTULA  EXCISION;  Surgeon: Tilda Burrow, MD;  Location: [facility];  Service: Gynecology;  Laterality: N/A;    OB History     Gravida  0   Para  0   Term  0   Preterm  0   AB  0   Living  0      SAB  0   IAB  0   Ectopic  0   Multiple  0   Live Births               Home Medications    Prior to Admission medications   Medication Sig Start Date End Date Taking? Authorizing Provider  albuterol (VENTOLIN HFA) 108 (90 Base) MCG/ACT inhaler Inhale 2 puffs into the lungs every 4 (four) hours as needed. 06/27/23  Yes Particia Nearing, PA-C  predniSONE (DELTASONE) 20 MG tablet Take 2 tablets (40 mg total) by mouth daily with breakfast. 06/27/23  Yes Particia Nearing, PA-C  promethazine-dextromethorphan (PROMETHAZINE-DM) 6.25-15 MG/5ML syrup Take 5 mLs by mouth 4 (four) times daily as needed. 06/27/23  Yes Particia Nearing, PA-C  atorvastatin (LIPITOR) 20 MG tablet  09/29/14   [provider]  gabapentin (NEURONTIN) 250 MG/5ML solution Take 250 mg by mouth 3 (three) times daily.    [provider]  telmisartan-hydrochlorothiazide (MICARDIS HCT) 40-12.5 MG per tablet Take 1 tablet by mouth daily.    [provider]    Family History Family History  Problem Relation Age of Onset   Coronary artery disease Brother    Hypertension Brother    Hyperlipidemia Brother    Coronary artery disease Other        cousins   COPD Mother    Cancer Mother    Heart disease Father    Hypertension Sister    Hyperlipidemia Sister    Early death Sister    Heart murmur Sister    Hypertension Brother    Hyperlipidemia Brother     Social History Social History   Tobacco Use   Smoking status: Former     Current packs/day: 0.00    Average packs/day: 1 pack/day for 25.0 years (25.0 ttl pk-yrs)    Types: Cigarettes    Start date: 12/08/1983    Quit date: 12/07/2008    Years since quitting: 14.5   Smokeless tobacco: Never  Substance Use Topics   Alcohol use: Yes    Alcohol/week: 14.0 standard drinks of alcohol    Types: 14 Cans of beer per week    Comment: several times per week   Drug use: No     Allergies   Flagyl [metronidazole] and Penicillins   Review of Systems Review of Systems Per HPI  Physical Exam Triage Vital Signs ED Triage Vitals  Encounter Vitals Group     BP 06/27/23 1212 (!) 160/70     Systolic BP Percentile --      Diastolic BP Percentile --      Pulse Rate 06/27/23 1212 81     Resp 06/27/23 1212 20     Temp 06/27/23 1212 98.3 F (36.8 C)     Temp Source 06/27/23 1212 Oral     SpO2 06/27/23 1212 98 %     Weight --      Height --      Head Circumference --      Peak Flow --      Pain Score 06/27/23 1213 3     Pain Loc --      Pain Education --      Exclude from Growth Chart --    No data found.  Updated Vital Signs BP (!) 160/70 (BP Location: Right Arm)   Pulse 81   Temp 98.3 F (36.8 C) (Oral)   Resp 20   SpO2 98%   Visual Acuity Right Eye Distance:   Left Eye Distance:   Bilateral Distance:    Right Eye Near:   Left Eye Near:    Bilateral Near:     Physical Exam Vitals and nursing note reviewed.  Constitutional:      Appearance: Normal appearance.  HENT:     Head: Atraumatic.     Right Ear: Tympanic membrane and external ear normal.     Left Ear: Tympanic membrane and external ear normal.     Nose: Nose normal.     Mouth/Throat:     Mouth: Mucous membranes are moist.     Pharynx: Posterior oropharyngeal erythema present.  Eyes:     Extraocular Movements: Extraocular movements intact.     Conjunctiva/sclera: Conjunctivae normal.  Cardiovascular:     Rate and Rhythm: Normal rate and regular rhythm.     Heart sounds:  Normal heart sounds.  Pulmonary:     Effort: Pulmonary effort is normal.     Breath sounds: Normal breath sounds. No wheezing or rales.  Musculoskeletal:        General: Normal range of motion.     Cervical back: Normal range of motion and neck supple.  Skin:    General: Skin is warm and dry.  Neurological:     Mental Status: She is alert and oriented to person, place, and time.  Psychiatric:        Mood and Affect: Mood normal.        Thought Content: Thought content normal.      UC Treatments / Results  Labs (all labs ordered are listed, but only abnormal results are displayed) Labs Reviewed - No data to display  EKG   Radiology No results found.  Procedures Procedures (including critical care time)  Medications Ordered in UC Medications - No data to display  Initial Impression / Assessment and Plan / UC Course  I have reviewed the triage vital signs and the nursing notes.  Pertinent labs & imaging results that were available during my care of the patient were reviewed by me and considered in my medical decision making (see chart for details).     Vitals and exam very reassuring today, no evidence on exam of a bacterial infection.  Suspect viral bronchitis.  Treat with prednisone, albuterol, Phenergan DM, supportive over-the-counter medications and home care.  Return for worsening symptoms.  Final Clinical Impressions(s) / UC Diagnoses   Final diagnoses:  Viral bronchitis   Discharge Instructions   None    ED Prescriptions     Medication Sig Dispense Auth. Provider   predniSONE (DELTASONE) 20 MG tablet Take 2 tablets (40 mg total) by mouth daily with breakfast. 10 tablet Particia Nearing, PA-C   albuterol (VENTOLIN HFA) 108 (90 Base) MCG/ACT inhaler Inhale 2 puffs into the lungs every 4 (four) hours as needed. 18 g Roosvelt Maser [place], [place]   promethazine-dextromethorphan (PROMETHAZINE-DM) 6.25-15 MG/5ML syrup Take 5 mLs by mouth 4 (four) times  daily as needed. 100 mL Particia Nearing, [place]      PDMP not reviewed this encounter.   Particia Nearing, [place] 06/27/23 1244

## 2023-06-27 NOTE — ED Triage Notes (Signed)
 Pt report she has had a cough x 8 days

## 2023-07-03 ENCOUNTER — Ambulatory Visit
Admission: RE | Admit: 2023-07-03 | Discharge: 2023-07-03 | Disposition: A | Discharge status: Home / Self Care | Source: Ambulatory Visit | Attending: Family Medicine | Admitting: Family Medicine

## 2023-07-03 VITALS — BP 159/66 | HR 70 | Temp 98.0°F | Resp 16

## 2023-07-03 DIAGNOSIS — J209 Acute bronchitis, unspecified: Secondary | ICD-10-CM | POA: Diagnosis not present

## 2023-07-03 DIAGNOSIS — R0982 Postnasal drip: Secondary | ICD-10-CM | POA: Diagnosis not present

## 2023-07-03 MED ORDER — AZELASTINE HCL 0.1 % NA SOLN: 1.0000 | Freq: Two times a day (BID) | NASAL | 0 refills | Status: AC

## 2023-07-03 MED ORDER — BUDESONIDE-FORMOTEROL FUMARATE 160-4.5 MCG/ACT IN AERO: 2.0000 | INHALATION_SPRAY | Freq: Two times a day (BID) | RESPIRATORY_TRACT | 0 refills | Status: AC

## 2023-07-03 MED ORDER — PROMETHAZINE-DM 6.25-15 MG/5ML PO SYRP: 5.0000 mL | ORAL_SOLUTION | Freq: Four times a day (QID) | ORAL | 0 refills | Status: AC | PRN

## 2023-07-03 NOTE — ED Triage Notes (Signed)
 Pt reports while taking the prednisone her cough got better, but after she completed her dose she developed a cough again since 06/27/23

## 2023-07-03 NOTE — ED Provider Notes (Signed)
 [facility]    CSN: [id number] Arrival date & time: 07/03/23  0954      History   Chief Complaint Chief Complaint  Patient presents with   Cough    Entered by patient    HPI Amy Clayton is a 63 y.o. female.   Patient presenting today with ongoing hacking dry cough, rhinorrhea, postnasal drainage for the past 2 weeks now.  Was seen on 06/27/2023 for the same and treated with a course of prednisone, Flonase, albuterol which she states did help temporarily but symptoms persist.  She denies fevers, chills, sinus pain or pressure, chest pain, shortness of breath, abdominal pain, vomiting, diarrhea.  Continuing to take her Flonase and albuterol, not currently trying anything over-the-counter for symptoms other than cough drops which do help some.  She states she has a history of bronchitis and is a former smoker but not diagnosed with COPD.    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/26/2014   Vulvar cysts 02/28/2014   MIXED HYPERLIPIDEMIA 06/26/2010   MURMUR 06/26/2010   CHEST PAIN UNSPECIFIED 06/26/2010   History of cardiovascular disorder 06/26/2010    Past Surgical History:  Procedure Laterality Date   COLONOSCOPY N/A 06/01/2014   Procedure: COLONOSCOPY;  Surgeon: Malissa Hippo, MD;  Location: [facility];  Service: Endoscopy;  Laterality: N/A;  145 - moved to 12:45 - Ann to notify pt   EXAMINATION UNDER ANESTHESIA N/A 07/17/2014   Procedure: EXAMINATION UNDER ANESTHESIA , RECTOVAGINAL;  Surgeon: Franky Macho Md, MD;  Location: [facility];  Service: General;  Laterality: N/A;   INCISION AND DRAINAGE ABSCESS N/A 12/12/2013    Procedure: INCISION AND DRAINAGE PERIRECTAL  ABSCESS;  Surgeon: Dalia Heading, MD;  Location: [facility];  Service: General;  Laterality: N/A;   LAPAROSCOPIC SALPINGOOPHERECTOMY Right 1985   MASS EXCISION N/A 03/14/2014   Procedure: EXCISION OF VULVAR FIBROSIS; EXCISION LEFT GLUTEAL FISTULA  IN ANO;  Surgeon: Tilda Burrow, MD;  Location: [facility];  Service: Gynecology;  Laterality: N/A;   TONSILLECTOMY     VESICO-VAGINAL FISTULA REPAIR N/A 09/26/2014   Procedure: RECTO-VAGINAL FISTULA  EXCISION;  Surgeon: Tilda Burrow, MD;  Location: [facility];  Service: Gynecology;  Laterality: N/A;    OB History     Gravida  0   Para  0   Term  0   Preterm  0   AB  0   Living  0      SAB  0   IAB  0   Ectopic  0   Multiple  0   Live Births               Home Medications    Prior to Admission medications   Medication Sig Start Date End Date Taking? Authorizing Provider  azelastine (ASTELIN) 0.1 % nasal spray Place 1 spray into both nostrils 2 (two) times daily. Use in each nostril as directed 07/03/23  Yes Particia Nearing, PA-C  budesonide-formoterol [facility]) 160-4.5 MCG/ACT inhaler Inhale 2 puffs into the lungs 2 (two) times daily. Rinse mouth  with water after each use 07/03/23  Yes Particia Nearing, PA-C  albuterol (VENTOLIN HFA) 108 (90 Base) MCG/ACT inhaler Inhale 2 puffs into the lungs every 4 (four) hours as needed. 06/27/23   Particia Nearing, PA-C  atorvastatin (LIPITOR) 20 MG tablet  09/29/14   [provider]  gabapentin (NEURONTIN) 250 MG/5ML solution Take 250 mg by mouth 3 (three) times daily.    [provider]  predniSONE (DELTASONE) 20 MG tablet Take 2 tablets (40 mg total) by mouth daily with breakfast. 06/27/23   Particia Nearing, PA-C  promethazine-dextromethorphan (PROMETHAZINE-DM) 6.25-15 MG/5ML syrup Take 5 mLs by mouth 4 (four) times daily as needed. 07/03/23   Particia Nearing, PA-C  telmisartan-hydrochlorothiazide  (MICARDIS HCT) 40-12.5 MG per tablet Take 1 tablet by mouth daily.    [provider]    Family History Family History  Problem Relation Age of Onset   Coronary artery disease Brother    Hypertension Brother    Hyperlipidemia Brother    Coronary artery disease Other        cousins   COPD Mother    Cancer Mother    Heart disease Father    Hypertension Sister    Hyperlipidemia Sister    Early death Sister    Heart murmur Sister    Hypertension Brother    Hyperlipidemia Brother     Social History Social History   Tobacco Use   Smoking status: Former    Current packs/day: 0.00    Average packs/day: 1 pack/day for 25.0 years (25.0 ttl pk-yrs)    Types: Cigarettes    Start date: 12/08/1983    Quit date: 12/07/2008    Years since quitting: 14.5   Smokeless tobacco: Never  Substance Use Topics   Alcohol use: Yes    Alcohol/week: 14.0 standard drinks of alcohol    Types: 14 Cans of beer per week    Comment: several times per week   Drug use: No     Allergies   Flagyl [metronidazole] and Penicillins   Review of Systems Review of Systems Per HPI  Physical Exam Triage Vital Signs ED Triage Vitals  Encounter Vitals Group     BP 07/03/23 1013 (!) 159/66     Systolic BP Percentile --      Diastolic BP Percentile --      Pulse Rate 07/03/23 1013 70     Resp 07/03/23 1013 16     Temp 07/03/23 1013 98 F (36.7 C)     Temp Source 07/03/23 1013 Oral     SpO2 07/03/23 1013 98 %     Weight --      Height --      Head Circumference --      Peak Flow --      Pain Score 07/03/23 1012 0     Pain Loc --      Pain Education --      Exclude from Growth Chart --    No data found.  Updated Vital Signs BP (!) 159/66 (BP Location: Right Arm)   Pulse 70   Temp 98 F (36.7 C) (Oral)   Resp 16   SpO2 98%   Visual Acuity Right Eye Distance:   Left Eye Distance:   Bilateral Distance:    Right Eye Near:   Left Eye Near:    Bilateral Near:     Physical  Exam Vitals and nursing note reviewed.  Constitutional:      Appearance:  Normal appearance.  HENT:     Head: Atraumatic.     Right Ear: Tympanic membrane and external ear normal.     Left Ear: Tympanic membrane and external ear normal.     Nose:     Comments: Boggy nasal turbinates bilaterally    Mouth/Throat:     Mouth: Mucous membranes are moist.     Pharynx: Posterior oropharyngeal erythema present.     Comments: Cobblestoning posterior oropharynx Eyes:     Extraocular Movements: Extraocular movements intact.     Conjunctiva/sclera: Conjunctivae normal.  Cardiovascular:     Rate and Rhythm: Normal rate and regular rhythm.     Heart sounds: Normal heart sounds.  Pulmonary:     Effort: Pulmonary effort is normal.     Breath sounds: Normal breath sounds. No wheezing or rales.  Musculoskeletal:        General: Normal range of motion.     Cervical back: Normal range of motion and neck supple.  Skin:    General: Skin is warm and dry.  Neurological:     Mental Status: She is alert and oriented to person, place, and time.  Psychiatric:        Mood and Affect: Mood normal.        Thought Content: Thought content normal.      UC Treatments / Results  Labs (all labs ordered are listed, but only abnormal results are displayed) Labs Reviewed - No data to display  EKG   Radiology No results found.  Procedures Procedures (including critical care time)  Medications Ordered in UC Medications - No data to display  Initial Impression / Assessment and Plan / UC Course  I have reviewed the triage vital signs and the nursing notes.  Pertinent labs & imaging results that were available during my care of the patient were reviewed by me and considered in my medical decision making (see chart for details).     Oxygen saturation 98% on room air and she appears well and in no acute distress.  Speaking in full sentences.  Suspect symptoms related to ongoing bronchitis/uncontrolled  seasonal allergy symptoms.  She is a former smoker, unclear if she has any chronic changes from this that could be exacerbating issue.  Will add Symbicort, Astelin, Phenergan DM.  Discussed supportive home care and return precautions.  Final Clinical Impressions(s) / UC Diagnoses   Final diagnoses:  Acute bronchitis, unspecified organism  Post-nasal discharge   Discharge Instructions   None    ED Prescriptions     Medication Sig Dispense Auth. Provider   budesonide-formoterol (SYMBICORT) 160-4.5 MCG/ACT inhaler Inhale 2 puffs into the lungs 2 (two) times daily. Rinse mouth with water after each use 1 each Particia Nearing, PA-C   azelastine (ASTELIN) 0.1 % nasal spray Place 1 spray into both nostrils 2 (two) times daily. Use in each nostril as directed 30 mL Particia Nearing, PA-C   promethazine-dextromethorphan (PROMETHAZINE-DM) 6.25-15 MG/5ML syrup Take 5 mLs by mouth 4 (four) times daily as needed. 100 mL Particia Nearing, [place]      PDMP not reviewed this encounter.   Particia Nearing, [place] 07/03/23 1120

## 2023-10-22 DIAGNOSIS — E782 Mixed hyperlipidemia: Secondary | ICD-10-CM | POA: Diagnosis not present

## 2023-10-22 DIAGNOSIS — R7301 Impaired fasting glucose: Secondary | ICD-10-CM | POA: Diagnosis not present

## 2023-10-29 DIAGNOSIS — M519 Unspecified thoracic, thoracolumbar and lumbosacral intervertebral disc disorder: Secondary | ICD-10-CM | POA: Diagnosis not present

## 2023-10-29 DIAGNOSIS — Z0000 Encounter for general adult medical examination without abnormal findings: Secondary | ICD-10-CM | POA: Diagnosis not present

## 2023-10-29 DIAGNOSIS — E782 Mixed hyperlipidemia: Secondary | ICD-10-CM | POA: Diagnosis not present

## 2023-10-29 DIAGNOSIS — Z79899 Other long term (current) drug therapy: Secondary | ICD-10-CM | POA: Diagnosis not present

## 2023-10-29 DIAGNOSIS — M797 Fibromyalgia: Secondary | ICD-10-CM | POA: Diagnosis not present

## 2023-10-29 DIAGNOSIS — R7301 Impaired fasting glucose: Secondary | ICD-10-CM | POA: Diagnosis not present

## 2023-10-29 DIAGNOSIS — R011 Cardiac murmur, unspecified: Secondary | ICD-10-CM | POA: Diagnosis not present

## 2023-10-29 DIAGNOSIS — I10 Essential (primary) hypertension: Secondary | ICD-10-CM | POA: Diagnosis not present

## 2023-10-29 DIAGNOSIS — Z87891 Personal history of nicotine dependence: Secondary | ICD-10-CM | POA: Diagnosis not present
